# Patient Record
Sex: Female | Born: 1960 | Hispanic: Yes | Marital: Married | Smoking: Former smoker
Health system: Southern US, Community
[De-identification: ages and names within clinical notes are randomized; demographics above are authoritative.]

## PROBLEM LIST (undated history)

## (undated) DIAGNOSIS — M1990 Unspecified osteoarthritis, unspecified site: Secondary | ICD-10-CM

## (undated) DIAGNOSIS — R0600 Dyspnea, unspecified: Secondary | ICD-10-CM

## (undated) DIAGNOSIS — E119 Type 2 diabetes mellitus without complications: Secondary | ICD-10-CM

## (undated) HISTORY — PX: CARPAL TUNNEL RELEASE: SHX101

---

## 2002-08-05 ENCOUNTER — Ambulatory Visit (HOSPITAL_COMMUNITY): Admission: RE | Admit: 2002-08-05 | Discharge: 2002-08-05 | Payer: Self-pay | Admitting: Neurosurgery

## 2002-08-05 ENCOUNTER — Encounter: Payer: Self-pay | Admitting: Neurosurgery

## 2004-08-07 ENCOUNTER — Ambulatory Visit: Payer: Self-pay | Admitting: Specialist

## 2005-09-16 ENCOUNTER — Emergency Department: Payer: Self-pay | Admitting: Emergency Medicine

## 2005-09-24 ENCOUNTER — Ambulatory Visit: Payer: Self-pay | Admitting: Surgery

## 2006-02-26 ENCOUNTER — Ambulatory Visit: Payer: Self-pay | Admitting: Family Medicine

## 2007-04-03 ENCOUNTER — Emergency Department: Payer: Self-pay | Admitting: Emergency Medicine

## 2007-05-13 ENCOUNTER — Ambulatory Visit: Payer: Self-pay | Admitting: Family Medicine

## 2007-05-31 ENCOUNTER — Ambulatory Visit: Payer: Self-pay | Admitting: Family Medicine

## 2009-01-24 ENCOUNTER — Ambulatory Visit: Payer: Self-pay | Admitting: Family Medicine

## 2011-01-09 ENCOUNTER — Emergency Department: Payer: Self-pay | Admitting: Emergency Medicine

## 2012-01-26 ENCOUNTER — Ambulatory Visit: Payer: Self-pay | Admitting: Specialist

## 2012-01-26 LAB — BASIC METABOLIC PANEL
Anion Gap: 13 (ref 7–16)
BUN: 9 mg/dL (ref 7–18)
Calcium, Total: 9.1 mg/dL (ref 8.5–10.1)
Chloride: 104 mmol/L (ref 98–107)
Co2: 24 mmol/L (ref 21–32)
Creatinine: 0.61 mg/dL (ref 0.60–1.30)
EGFR (African American): >60
EGFR (Non-African Amer.): >60
Glucose: 170 mg/dL — ABNORMAL HIGH (ref 65–99)
Osmolality: 284 (ref 275–301)
Potassium: 3.6 mmol/L (ref 3.5–5.1)
Sodium: 141 mmol/L (ref 136–145)

## 2012-01-30 ENCOUNTER — Ambulatory Visit: Payer: Self-pay | Admitting: Specialist

## 2014-08-03 ENCOUNTER — Emergency Department: Payer: Self-pay | Admitting: Emergency Medicine

## 2015-01-08 ENCOUNTER — Emergency Department: Payer: Self-pay | Admitting: Emergency Medicine

## 2015-02-18 NOTE — Op Note (Signed)
PATIENT NAME:  Gwendolyn Fuller, Hayes MR#:  [id number] DATE OF BIRTH:  [birth year]  DATE OF PROCEDURE:  01/30/2012  PREOPERATIVE DIAGNOSIS:  [name] Quervain tenosynovitis right wrist.  POSTOPERATIVE DIAGNOSIS:  [name] Quervain tenosynovitis right wrist.  PROCEDURE: Release of first dorsal extensor compartment, right wrist.   SURGEON: Myra Rudehristopher Chalsey Leeth, M.D.   ANESTHESIA: General.   COMPLICATIONS: None.   TOURNIQUET TIME: Approximately 20 minutes.   DESCRIPTION OF PROCEDURE: After adequate induction of general anesthesia, the right upper extremity is thoroughly prepped with alcohol and ChloraPrep and draped in standard sterile fashion. The extremity is wrapped out with the Esmarch bandage and pneumatic tourniquet elevated to 250 mmHg. Under loupe magnification, a standard transverse incision is made approximately 1 cm proximal to the radial styloid over the first dorsal extensor compartment. The dissection is carried down to the first dorsal extensor compartment. This is incised first with the knife and then the scissors and there are seen to be two separate compartments present and both of these are released. All of the tendons are identified. Careful check is made both proximally and distally to ensure that complete release had been obtained. The wound is thoroughly irrigated multiple times. Skin edges are infiltrated with 0.5% plain Marcaine. The skin is closed with a running subcuticular 4-0 nylon. A soft bulky dressing is applied. The tourniquet is released. The patient is returned to the recovery room in satisfactory condition having tolerated the procedure quite well.    ____________________________ Clare Gandyhristopher E. Anvika Gashi, MD ces:bjt D: 01/30/2012 13:37:41 ET T: 01/30/2012 14:54:13 ET JOB#: [id number] Clare GandyHRISTOPHER E Lameka Disla MD ELECTRONICALLY SIGNED 02/04/2012 17:08

## 2015-06-14 ENCOUNTER — Other Ambulatory Visit: Payer: Self-pay | Admitting: Family Medicine

## 2015-06-15 ENCOUNTER — Telehealth: Payer: Self-pay | Admitting: Family Medicine

## 2015-06-15 NOTE — Telephone Encounter (Signed)
Pt has not been seen since 06/08/2014 . Dr.Morrisey will have to ok any refills.

## 2015-06-15 NOTE — Telephone Encounter (Signed)
Pt called back and stated that her blood sugars have been high(300+) and that she is completely out of her meds. I informed her that she has not had an office visit in a year and that Dr. Thana Ates would have to clear any refills.

## 2015-06-15 NOTE — Telephone Encounter (Signed)
Pt is requesting a refill on her diabetic medications  Pt is completely out of medications.  1. Invokana  2. 1st Tier Unifine Pentips Plus 32G X 90 3. Levemir FlexTouch 100 unit/ml  Pt is scheduled for an OV on Tues. 06/19/15.  Pt uses [facility].  Please contact patient once this is done.

## 2015-06-16 ENCOUNTER — Emergency Department
Admission: EM | Admit: 2015-06-16 | Discharge: 2015-06-16 | Disposition: A | Discharge status: Home / Self Care | Payer: Medicaid Other | Attending: Emergency Medicine | Admitting: Emergency Medicine

## 2015-06-16 DIAGNOSIS — Z720 Tobacco use: Secondary | ICD-10-CM | POA: Diagnosis not present

## 2015-06-16 DIAGNOSIS — R739 Hyperglycemia, unspecified: Secondary | ICD-10-CM

## 2015-06-16 DIAGNOSIS — E1165 Type 2 diabetes mellitus with hyperglycemia: Secondary | ICD-10-CM | POA: Insufficient documentation

## 2015-06-16 DIAGNOSIS — H538 Other visual disturbances: Secondary | ICD-10-CM | POA: Diagnosis not present

## 2015-06-16 HISTORY — DX: Unspecified osteoarthritis, unspecified site: M19.90

## 2015-06-16 HISTORY — DX: Type 2 diabetes mellitus without complications: E11.9

## 2015-06-16 LAB — BASIC METABOLIC PANEL
ANION GAP: 12 (ref 5–15)
BUN: 11 mg/dL (ref 6–20)
CALCIUM: 9.2 mg/dL (ref 8.9–10.3)
CO2: 23 mmol/L (ref 22–32)
CREATININE: 0.7 mg/dL (ref 0.44–1.00)
Chloride: 100 mmol/L — ABNORMAL LOW (ref 101–111)
GFR calc Af Amer: >60 mL/min (ref >60)
GLUCOSE: 348 mg/dL — AB (ref 65–99)
Potassium: 3.8 mmol/L (ref 3.5–5.1)
Sodium: 135 mmol/L (ref 135–145)

## 2015-06-16 LAB — CBC
HCT: 44.5 % (ref 35.0–47.0)
HEMOGLOBIN: 15.1 g/dL (ref 12.0–16.0)
MCH: 29.1 pg (ref 26.0–34.0)
MCHC: 34 g/dL (ref 32.0–36.0)
MCV: 85.7 fL (ref 80.0–100.0)
PLATELETS: 318 10*3/uL (ref 150–440)
RBC: 5.19 MIL/uL (ref 3.80–5.20)
RDW: 13.6 % (ref 11.5–14.5)
WBC: 10.2 10*3/uL (ref 3.6–11.0)

## 2015-06-16 LAB — URINALYSIS COMPLETE WITH MICROSCOPIC (ARMC ONLY)
BILIRUBIN URINE: NEGATIVE
Bacteria, UA: NONE SEEN
HGB URINE DIPSTICK: NEGATIVE
KETONES UR: NEGATIVE mg/dL
Leukocytes, UA: NEGATIVE
NITRITE: NEGATIVE
Protein, ur: NEGATIVE mg/dL
SPECIFIC GRAVITY, URINE: 1.014 (ref 1.005–1.030)
pH: 5 (ref 5.0–8.0)

## 2015-06-16 LAB — GLUCOSE, CAPILLARY
GLUCOSE-CAPILLARY: 323 mg/dL — AB (ref 65–99)
Glucose-Capillary: 356 mg/dL — ABNORMAL HIGH (ref 65–99)
Glucose-Capillary: 390 mg/dL — ABNORMAL HIGH (ref 65–99)
Glucose-Capillary: 344 mg/dL — ABNORMAL HIGH (ref 65–99)

## 2015-06-16 MED ORDER — SODIUM CHLORIDE 0.9 % IV BOLUS (SEPSIS)
1000.0000 mL | Freq: Once | INTRAVENOUS | Status: AC
  Administered 2015-06-16: 1000 mL via INTRAVENOUS

## 2015-06-16 MED ORDER — INSULIN ASPART 100 UNIT/ML ~~LOC~~ SOLN
10.0000 [IU] | Freq: Once | SUBCUTANEOUS | Status: AC
  Administered 2015-06-16: 10 [IU] via SUBCUTANEOUS
  Filled 2015-06-16: qty 10

## 2015-06-16 MED ORDER — GLIMEPIRIDE 4 MG PO TABS: 8.0000 mg | ORAL_TABLET | Freq: Every day | ORAL | Status: DC

## 2015-06-16 NOTE — ED Provider Notes (Signed)
[facility] Emergency Department Provider Note  ____________________________________________  Time seen: 4 PM  I have reviewed the triage vital signs and the nursing notes.   HISTORY  Chief Complaint Hyperglycemia    HPI Gwendolyn Fuller is a 54 y.o. female who presents with complaints of hyperglycemia. Patient with the pharmacy but they would not refill her insulin because she has not seen her physician in quite some time. She reports some mild blurry vision and polyuria. She denies fevers chills. She denies abdominal pain. No cough no shortness of breath. She requests a refill of her insulin and her sulfonylurea     Past Medical History  Diagnosis Date  . Diabetes mellitus without complication   . Arthritis     There are no active problems to display for this patient.   Past Surgical History  Procedure Laterality Date  . Carpal tunnel release Bilateral     No current outpatient prescriptions on file.  Allergies Review of patient's allergies indicates no known allergies.  No family history on file.  Social History Social History  Substance Use Topics  . Smoking status: Current Every Day Smoker -- 1.00 packs/day    Types: Cigarettes  . Smokeless tobacco: Never Used  . Alcohol Use: No    Review of Systems  Constitutional: Negative for fever. Eyes: Positive for blurry vision ENT: Negative for sore throat Cardiovascular: Negative for chest pain. Respiratory: Negative for shortness of breath. Gastrointestinal: Negative for abdominal pain, vomiting and diarrhea. Genitourinary: Negative for dysuria. Positive for polyuria Musculoskeletal: Negative for back pain. Skin: Negative for rash. Neurological: Negative for headaches or focal weakness Psychiatric: No anxiety    ____________________________________________   PHYSICAL EXAM:  VITAL SIGNS: ED Triage Vitals  Enc Vitals Group     BP 06/16/15 1339 109/65 mmHg     Pulse Rate  06/16/15 1339 84     Resp 06/16/15 1339 18     Temp 06/16/15 1339 98.1 F (36.7 C)     Temp Source 06/16/15 1339 Oral     SpO2 06/16/15 1339 98 %     Weight 06/16/15 1339 142 lb (64.411 kg)     Height 06/16/15 1339 5\' 2"  (1.575 m)     Head Cir --      Peak Flow --      Pain Score --      Pain Loc --      Pain Edu? --      Excl. in GC? --      Constitutional: Alert and oriented. Well appearing and in no distress. Eyes: Conjunctivae are normal.  ENT   Head: Normocephalic and atraumatic.   Mouth/Throat: Mucous membranes are moist. Cardiovascular: Normal rate, regular rhythm. Normal and symmetric distal pulses are present in all extremities. No murmurs, rubs, or gallops. Respiratory: Normal respiratory effort without tachypnea nor retractions. Breath sounds are clear and equal bilaterally.  Gastrointestinal: Soft and non-tender in all quadrants. No distention. There is no CVA tenderness. Genitourinary: deferred Musculoskeletal: Nontender with normal range of motion in all extremities. No lower extremity tenderness nor edema. Neurologic:  Normal speech and language. No gross focal neurologic deficits are appreciated. Skin:  Skin is warm, dry and intact. No rash noted. Psychiatric: Mood and affect are normal. Patient exhibits appropriate insight and judgment.  ____________________________________________    LABS (pertinent positives/negatives)  Labs Reviewed  BASIC METABOLIC PANEL - Abnormal; Notable for the following:    Chloride 100 (*)    Glucose, Bld 348 (*)  All other components within normal limits  URINALYSIS COMPLETEWITH MICROSCOPIC ([facility] ONLY) - Abnormal; Notable for the following:    Color, Urine YELLOW (*)    APPearance CLEAR (*)    Glucose, UA >500 (*)    Squamous Epithelial / LPF 0-5 (*)    All other components within normal limits  GLUCOSE, CAPILLARY - Abnormal; Notable for the following:    Glucose-Capillary 356 (*)    All other components within  normal limits  GLUCOSE, CAPILLARY - Abnormal; Notable for the following:    Glucose-Capillary 390 (*)    All other components within normal limits  CBC  CBG MONITORING, ED    ____________________________________________   EKG  None  ____________________________________________    RADIOLOGY I have personally reviewed any xrays that were ordered on this patient: None  ____________________________________________   PROCEDURES  Procedure(s) performed: none  Critical Care performed: none  ____________________________________________   INITIAL IMPRESSION / ASSESSMENT AND PLAN / ED COURSE  Pertinent labs & imaging results that were available during my care of the patient were reviewed by me and considered in my medical decision making (see chart for details).  Patient with blood glucose in the high 300s. We will give 10 of subcutaneous insulin here.   1 hour after insulin patient's glucose was still in the 300s. She is unwilling to wait any longer  I will refill her sulfa urea but she has follow-up with her PCP in 2 days to discuss insulin dosage. She has received 1 L normal saline in the emergency department    ____________________________________________   FINAL CLINICAL IMPRESSION(S) / ED DIAGNOSES  Final diagnoses:  Hyperglycemia     Jene Every, MD 06/16/15 [id number]

## 2015-06-16 NOTE — ED Notes (Signed)
Pt c/o running out of her insulin and her PCP would not RX any until seen on Tuesday..states FS last night was >500 today 384.Marland Kitchen

## 2015-06-16 NOTE — Discharge Instructions (Signed)

## 2015-06-19 ENCOUNTER — Encounter: Payer: Self-pay | Admitting: Family Medicine

## 2015-06-19 ENCOUNTER — Telehealth: Payer: Self-pay | Admitting: Family Medicine

## 2015-06-19 ENCOUNTER — Other Ambulatory Visit: Payer: Self-pay

## 2015-06-19 ENCOUNTER — Ambulatory Visit (INDEPENDENT_AMBULATORY_CARE_PROVIDER_SITE_OTHER): Payer: Medicaid Other | Admitting: Family Medicine

## 2015-06-19 VITALS — BP 128/82 | HR 97 | Temp 97.9°F | Resp 16 | Ht 62.0 in | Wt 141.4 lb

## 2015-06-19 DIAGNOSIS — I10 Essential (primary) hypertension: Secondary | ICD-10-CM

## 2015-06-19 DIAGNOSIS — Z720 Tobacco use: Secondary | ICD-10-CM | POA: Diagnosis not present

## 2015-06-19 DIAGNOSIS — E1140 Type 2 diabetes mellitus with diabetic neuropathy, unspecified: Secondary | ICD-10-CM

## 2015-06-19 DIAGNOSIS — IMO0002 Reserved for concepts with insufficient information to code with codable children: Secondary | ICD-10-CM | POA: Insufficient documentation

## 2015-06-19 DIAGNOSIS — J449 Chronic obstructive pulmonary disease, unspecified: Secondary | ICD-10-CM

## 2015-06-19 DIAGNOSIS — E1165 Type 2 diabetes mellitus with hyperglycemia: Secondary | ICD-10-CM

## 2015-06-19 DIAGNOSIS — E785 Hyperlipidemia, unspecified: Secondary | ICD-10-CM

## 2015-06-19 LAB — GLUCOSE, POCT (MANUAL RESULT ENTRY): POC GLUCOSE: 371 mg/dL — AB (ref 70–99)

## 2015-06-19 LAB — POCT GLYCOSYLATED HEMOGLOBIN (HGB A1C): HEMOGLOBIN A1C: 10.7

## 2015-06-19 MED ORDER — DEXTROMETHORPHAN POLISTIREX ER 30 MG/5ML PO SUER
30.0000 mg | Freq: Two times a day (BID) | ORAL | Status: DC
Start: 2015-06-19 — End: 2017-12-19

## 2015-06-19 MED ORDER — INSULIN PEN NEEDLE 32G X 4 MM MISC: 1.0000 | Status: DC

## 2015-06-19 MED ORDER — INSULIN DETEMIR 100 UNIT/ML ~~LOC~~ SOLN: 1.0000 [IU] | Freq: Every day | SUBCUTANEOUS | Status: DC

## 2015-06-19 MED ORDER — GLUCOSE BLOOD VI STRP: ORAL_STRIP | Status: DC

## 2015-06-19 MED ORDER — ATORVASTATIN CALCIUM 40 MG PO TABS: 40.0000 mg | ORAL_TABLET | Freq: Every day | ORAL | Status: DC

## 2015-06-19 MED ORDER — INSULIN ASPART 100 UNIT/ML ~~LOC~~ SOLN: 10.0000 [IU] | Freq: Once | SUBCUTANEOUS | Status: DC

## 2015-06-19 MED ORDER — LISINOPRIL 10 MG PO TABS
10.0000 mg | ORAL_TABLET | Freq: Every day | ORAL | Status: DC
Start: 2015-06-19 — End: 2016-07-22

## 2015-06-19 MED ORDER — GLIMEPIRIDE 4 MG PO TABS: 8.0000 mg | ORAL_TABLET | Freq: Every day | ORAL | Status: DC

## 2015-06-19 NOTE — Telephone Encounter (Signed)
Patient is at the pharmacy waiting for her refill on Glimepiride. Please call it in to [facility].

## 2015-06-19 NOTE — Progress Notes (Addendum)
Name: Gwendolyn Fuller   MRN: [id number]    DOB: [birth year]   Date:06/19/2015       Progress Note  Subjective  Chief Complaint  Chief Complaint  Patient presents with  . Diabetes    pt has not been seen for a year for diabetes. Pt has been out of medications since 06/15/15  . COPD  . Hospitalization Follow-up    06/16/15 pt was hospitalized for high blood sugars    HPI  Uncontrolled diabetes with neuropathy  Patient remains uncontrolled diabetes, not been back the office in over one year. She is a chronic problem with noncompliance with her medications diet and exercise. She has currently been prescribed glimepiride 8 mg close Levemir 100 mg daily at bedtime. An endocrine milligrams daily. As noted above her compliance with diet and exercise and medication has been less than ideal.  Hyperlipidemia  Patient has been prescribed statin but is not currently taking it.  COPD patient continues with shortness of breath and intermittent cough which more recently has been productive. No documented fever or chills. She continues to smoke despite her COPD and concomitant risk for heart disease with her diabetes hypertension hyperlipidemia noncompliance sedentary lifestyle and stress  Hypertension.  Patient currently is prescribed an ACE inhibitor but is no longer taking apparently.  Problem COPD exacerbation  Past Medical History  Diagnosis Date  . Diabetes mellitus without complication   . Arthritis     Social History  Substance Use Topics  . Smoking status: Current Every Day Smoker -- 1.00 packs/day    Types: Cigarettes  . Smokeless tobacco: Never Used  . Alcohol Use: No     Current outpatient prescriptions:  .  glimepiride (AMARYL) 4 MG tablet, Take 2 tablets (8 mg total) by mouth daily with breakfast., Disp: 20 tablet, Rfl: 0 .  insulin detemir (LEVEMIR) 100 UNIT/ML injection, Inject into the skin at bedtime., Disp: , Rfl:   No Known Allergies  Review of Systems   Constitutional: Positive for malaise/fatigue. Negative for fever, chills and weight loss.  HENT: Positive for congestion. Negative for hearing loss, sore throat and tinnitus.   Eyes: Negative for blurred vision, double vision and redness.  Respiratory: Positive for cough, sputum production and shortness of breath. Negative for hemoptysis.   Cardiovascular: Negative for chest pain, palpitations, orthopnea, claudication and leg swelling.  Gastrointestinal: Negative for heartburn, nausea, vomiting, diarrhea, constipation and blood in stool.  Genitourinary: Positive for frequency. Negative for dysuria, urgency and hematuria.  Musculoskeletal: Positive for back pain and joint pain. Negative for myalgias, falls and neck pain.  Skin: Negative for itching.  Neurological: Positive for sensory change. Negative for dizziness, tingling, tremors, focal weakness, seizures, loss of consciousness, weakness and headaches.  Endo/Heme/Allergies: Positive for polydipsia. Does not bruise/bleed easily.  Psychiatric/Behavioral: Positive for depression. Negative for substance abuse. The patient is nervous/anxious and has insomnia.      Objective  Filed Vitals:   06/19/15 0908  BP: 128/82  Pulse: 97  Temp: 97.9 F (36.6 C)  Resp: 16  Height: 5\' 2"  (1.575 m)  Weight: 141 lb 7 oz (64.156 kg)  SpO2: 97%     Physical Exam  Constitutional: [name], place, and time and well-developed, well-nourished, and in no distress.  HENT:  Head: Normocephalic.  Eyes: EOM are normal. Pupils are equal, round, and reactive to light.  Neck: Normal range of motion. No thyromegaly present.  Cardiovascular: Normal rate, regular rhythm and normal heart sounds.   No  murmur heard. Pulmonary/Chest: Effort normal and breath sounds normal.  Abdominal: Soft. Bowel sounds are normal.  Musculoskeletal: Normal range of motion. She exhibits no edema.  Neurological: She is alert and oriented to person, place, and  time. No cranial nerve deficit. Gait normal.  Skin: Skin is warm and dry. No rash noted.  Psychiatric: Memory normal.  Anxious and somewhat loquacious      Assessment & Plan    1. Uncontrolled type 2 diabetes with neuropathy History current noncompliance well-controlled - POCT HgB A1C - POCT Glucose (CBG) - insulin aspart (novoLOG) injection 10 Units; Inject 0.1 mLs (10 Units total) into the skin once. - Fructosamine - glucose blood (ACCU-CHEK AVIVA PLUS) test strip; Use as instructed  Dispense: 100 each; Refill: 12 - Insulin Pen Needle (UNIFINE PENTIPS) 32G X 4 MM MISC; 1 Package by Does not apply route 1 day or 1 dose.  Dispense: 30 each; Refill: 3 - insulin detemir (LEVEMIR) 100 UNIT/ML injection; Inject 0.01 mLs (1 Units total) into the skin at bedtime.  Dispense: 10 mL; Refill: 3  2. Essential hypertension - lisinopril (PRINIVIL,ZESTRIL) 10 MG tablet; Take 1 tablet (10 mg total) by mouth daily.  Dispense: 90 tablet; Refill: 3  3. Hyperlipidemia  Atorvastatin  - atorvastatin (LIPITOR) 40 MG tablet; Take 1 tablet (40 mg total) by mouth daily.  Dispense: 90 tablet; Refill: 3  4. Chronic obstructive pulmonary disease, unspecified COPD, unspecified chronic bronchitis type Attending I again emphasized the need to discontinue smoking smoking cessation - dextromethorphan (DELSYM) 30 MG/5ML liquid 30 mg; Take 5 mLs (30 mg total) by mouth 2 (two) times daily.  5. Tobacco abuse Smoking cessation encouraged

## 2015-06-20 LAB — FRUCTOSAMINE: Fructosamine: 479 umol/L — ABNORMAL HIGH (ref 0–285)

## 2015-06-21 NOTE — Addendum Note (Signed)
Addended by: Dennison Mascot on: 06/21/2015 08:07 AM   Modules accepted: Kipp Brood

## 2015-07-23 ENCOUNTER — Telehealth: Payer: Self-pay | Admitting: Family Medicine

## 2015-07-23 ENCOUNTER — Ambulatory Visit: Payer: Medicaid Other | Admitting: Family Medicine

## 2015-07-23 DIAGNOSIS — E785 Hyperlipidemia, unspecified: Secondary | ICD-10-CM

## 2015-07-23 NOTE — Telephone Encounter (Signed)
Pt needs refill on Glimepieride  . Pt has only one left

## 2015-07-24 MED ORDER — GLIMEPIRIDE 4 MG PO TABS: 8.0000 mg | ORAL_TABLET | Freq: Every day | ORAL | Status: DC

## 2015-07-24 NOTE — Telephone Encounter (Signed)
Script sent to pharmacy.

## 2015-07-25 ENCOUNTER — Encounter: Payer: Self-pay | Admitting: Family Medicine

## 2015-07-25 ENCOUNTER — Ambulatory Visit (INDEPENDENT_AMBULATORY_CARE_PROVIDER_SITE_OTHER): Payer: Medicaid Other | Admitting: Family Medicine

## 2015-07-25 VITALS — BP 118/68 | HR 84 | Temp 97.5°F | Resp 16 | Wt 144.2 lb

## 2015-07-25 DIAGNOSIS — G5601 Carpal tunnel syndrome, right upper limb: Secondary | ICD-10-CM | POA: Diagnosis not present

## 2015-07-25 DIAGNOSIS — G5602 Carpal tunnel syndrome, left upper limb: Secondary | ICD-10-CM | POA: Diagnosis not present

## 2015-07-25 DIAGNOSIS — G990 Autonomic neuropathy in diseases classified elsewhere: Secondary | ICD-10-CM | POA: Diagnosis not present

## 2015-07-25 DIAGNOSIS — IMO0001 Reserved for inherently not codable concepts without codable children: Secondary | ICD-10-CM

## 2015-07-25 DIAGNOSIS — J029 Acute pharyngitis, unspecified: Secondary | ICD-10-CM | POA: Diagnosis not present

## 2015-07-25 DIAGNOSIS — E785 Hyperlipidemia, unspecified: Secondary | ICD-10-CM

## 2015-07-25 DIAGNOSIS — E1143 Type 2 diabetes mellitus with diabetic autonomic (poly)neuropathy: Secondary | ICD-10-CM

## 2015-07-25 DIAGNOSIS — M1990 Unspecified osteoarthritis, unspecified site: Secondary | ICD-10-CM | POA: Diagnosis not present

## 2015-07-25 DIAGNOSIS — I10 Essential (primary) hypertension: Secondary | ICD-10-CM

## 2015-07-25 DIAGNOSIS — G5603 Carpal tunnel syndrome, bilateral upper limbs: Secondary | ICD-10-CM

## 2015-07-25 DIAGNOSIS — IMO0002 Reserved for concepts with insufficient information to code with codable children: Secondary | ICD-10-CM

## 2015-07-25 DIAGNOSIS — J449 Chronic obstructive pulmonary disease, unspecified: Secondary | ICD-10-CM | POA: Diagnosis not present

## 2015-07-25 DIAGNOSIS — E1165 Type 2 diabetes mellitus with hyperglycemia: Principal | ICD-10-CM

## 2015-07-25 LAB — POCT RAPID STREP A (OFFICE): Rapid Strep A Screen: NEGATIVE

## 2015-07-25 MED ORDER — MELOXICAM 15 MG PO TABS: 15.0000 mg | ORAL_TABLET | Freq: Every day | ORAL | Status: DC

## 2015-07-25 MED ORDER — GLIMEPIRIDE 4 MG PO TABS: 8.0000 mg | ORAL_TABLET | Freq: Every day | ORAL | Status: DC

## 2015-07-25 NOTE — Progress Notes (Signed)
Name: Gwendolyn Fuller   MRN: [id number]    DOB: 25-Dec-1960   Date:07/25/2015       Progress Note  Subjective  Chief Complaint  Chief Complaint  Patient presents with  . Diabetes  . Carpal Tunnel    HPI  Diabetes  Patient presents for follow-up of diabetes which is present for over 5 years. Is currently on a regimen of Imdur, 100 mg daily and Levemir 58 units daily glyburide 8 mg daily . Patient states intermittent with their diet and exercise. There's been no hypoglycemic episodes and there is no polyuria polydipsia polyphagia. [name] been in the low around mid 100s with a high around low 300s . There is no end organ disease.  Last diabetic eye exam was greater than 1 year ago.   Last visit with dietitian was greater than 1 year ago. Last microalbumin was obtained 20 in 05/30/2015 .   Hypertension   Patient presents for follow-up of hypertension. It has been present for over greater than 5 years.  Patient states that there is compliance with medical regimen which consists of lisinopril 10 mg daily . There is no end organ disease. Cardiac risk factors include hypertension hyperlipidemia and diabetes.  Exercise regimen consist of minimal .  Diet consist of limited ADA .  COPD Patient continues to smoke a regular basis. [name] and has not had it refilled. Has minimal cough in the a.m.  Carpal tunnel syndrome  Bilateral pain and numbness in the dorsal aspect of the wrist radiating to the hands  Complaint of bilateral wrist and hand pain. [name].  Arthritis Pain and both hands particularly the left thumb after an injury while closing a computer that  Complaint of pain and discomfort in the fingers O Pham but particularly the left thumb. 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 Several weeks ago   Past Medical History  Diagnosis Date  . Diabetes mellitus  without complication   . Arthritis     Social History  Substance Use Topics  . Smoking status: Current Every Day Smoker -- 1.00 packs/day    Types: Cigarettes  . Smokeless tobacco: Never Used  . Alcohol Use: No     Current outpatient prescriptions:  .  atorvastatin (LIPITOR) 40 MG tablet, Take 1 tablet (40 mg total) by mouth daily., Disp: 90 tablet, Rfl: 3 .  glimepiride (AMARYL) 4 MG tablet, Take 2 tablets (8 mg total) by mouth daily with breakfast., Disp: 60 tablet, Rfl: 5 .  glucose blood (ACCU-CHEK AVIVA PLUS) test strip, Use as instructed, Disp: 100 each, Rfl: 12 .  insulin detemir (LEVEMIR) 100 UNIT/ML injection, Inject 0.01 mLs (1 Units total) into the skin at bedtime., Disp: 10 mL, Rfl: 3 .  Insulin Pen Needle (UNIFINE PENTIPS) 32G X 4 MM MISC, 1 Package by Does not apply route 1 day or 1 dose., Disp: 30 each, Rfl: 3 .  lisinopril (PRINIVIL,ZESTRIL) 10 MG tablet, Take 1 tablet (10 mg total) by mouth daily., Disp: 90 tablet, Rfl: 3  Current facility-administered medications:  .  dextromethorphan (DELSYM) 30 MG/5ML liquid 30 mg, 30 mg, Oral, BID, Ashok Norris, MD .  insulin aspart (novoLOG) injection 10 Units, 10 Units, Subcutaneous, Once, Ashok Norris, MD  No Known Allergies  Review of Systems  Constitutional: Negative for fever, chills and weight loss.  HENT: Negative for congestion, hearing loss,  sore throat and tinnitus.   Eyes: Negative for blurred vision, double vision and redness.  Respiratory: Positive for cough and sputum production. Negative for hemoptysis and shortness of breath.   Cardiovascular: Negative for chest pain, palpitations, orthopnea, claudication and leg swelling.  Gastrointestinal: Negative for heartburn, nausea, vomiting, diarrhea, constipation and blood in stool.  Genitourinary: Negative for dysuria, urgency, frequency and hematuria.  Musculoskeletal: Positive for back pain and joint pain (both wrists and hands particularly the left thumb).  Negative for myalgias, falls and neck pain.  Skin: Negative for itching.  Neurological: Positive for weakness. Negative for dizziness, tingling, tremors, focal weakness, seizures, loss of consciousness and headaches.  Endo/Heme/Allergies: Does not bruise/bleed easily.  Psychiatric/Behavioral: Negative for depression and substance abuse. The patient is nervous/anxious. The patient does not have insomnia.      Objective  Filed Vitals:   07/25/15 1042  BP: 118/68  Pulse: 84  Temp: 97.5 F (36.4 C)  Resp: 16  Weight: 144 lb 3 oz (65.403 kg)  SpO2: 97%     Physical Exam  Constitutional: [name], place, and time and well-developed, well-nourished, and in no distress.  HENT:  Head: Normocephalic.  Eyes: EOM are normal. Pupils are equal, round, and reactive to light.  Neck: Normal range of motion. No thyromegaly present.  Cardiovascular: Normal rate, regular rhythm and normal heart sounds.   No murmur heard. Pulmonary/Chest:  Slightly diminished breath sounds throughout.  Musculoskeletal: She exhibits tenderness (tender along the lumbosacral areas bilaterally straight leg raising is positive at 30). She exhibits no edema.  Neurological: She is alert and oriented to person, place, and time. No cranial nerve deficit. Gait normal.  Skin: Skin is warm and dry. No rash noted.  Psychiatric: Memory and affect normal.      Assessment & Plan  1. Uncontrolled diabetes mellitus with peripheral autonomic neuropathy Better control since adding in the, - Fructosamine  2. Essential hypertension Well-controlled - Comprehensive Metabolic Panel (CMET)  3. Hyperlipidemia Labs - Comprehensive Metabolic Panel (CMET) - Lipid Profile - TSH  4. COPD bronchitis Again encouraged discontinuation of smoking "to do PFTs on return to office  5. Pharyngitis Rule out strep - POCT rapid strep A  6. Arthritis Rule out connective tissue disease or autoimmune arthritis  - DG  Hand Complete Left; Future - CBC - Sed Rate (ESR)  7.carpal tunnel syndrome - Wrist splint

## 2015-08-17 ENCOUNTER — Ambulatory Visit: Payer: Medicaid Other | Admitting: Family Medicine

## 2015-09-25 ENCOUNTER — Telehealth: Payer: Self-pay | Admitting: Family Medicine

## 2015-09-25 NOTE — Telephone Encounter (Signed)
Patient was offered appointment with Dr Carlynn PurlSowles for this afternoon, however, she will only have transportation until 1pm due to son having to go to work. I then spoke with Myriam JacobsonHelen and she told me to offer the patient to go to Urgent Care. Patient stated that she called all the Urgent Cares and they are not able to do what she needs done to help her swollen gums. Patient then ask if I would please ask a provider here if they would please call her in an [facility] to help her gums because she is in a lot of pain. I told her that I will ask. Last seen by Dr Thana AtesMorrisey 07/25/15

## 2015-09-25 NOTE — Telephone Encounter (Signed)
Patient is reqeusting a referral to a dentist due to inflammed gums. All her teeth are throbbing. Think she has abcess/bubble located in her mouth. It hurts to talk and eat.

## 2015-09-25 NOTE — Telephone Encounter (Signed)
Patient spoke with Efraim KaufmannMelissa and told her that she went to urgent care. They told her that is there is no improvement to call her PCP to schedule appointment for tomorrow

## 2015-09-25 NOTE — Telephone Encounter (Signed)
I can't call in antibiotics for her gum, needs to go to Urgent Care, or come in tomorrow

## 2015-09-25 NOTE — Telephone Encounter (Signed)
errenous °

## 2015-09-28 ENCOUNTER — Ambulatory Visit (INDEPENDENT_AMBULATORY_CARE_PROVIDER_SITE_OTHER): Payer: Medicaid Other | Admitting: Family Medicine

## 2015-09-28 ENCOUNTER — Encounter: Payer: Self-pay | Admitting: Family Medicine

## 2015-09-28 VITALS — BP 126/72 | HR 111 | Resp 16 | Ht 64.0 in | Wt 140.2 lb

## 2015-09-28 DIAGNOSIS — IMO0002 Reserved for concepts with insufficient information to code with codable children: Secondary | ICD-10-CM

## 2015-09-28 DIAGNOSIS — E1165 Type 2 diabetes mellitus with hyperglycemia: Secondary | ICD-10-CM | POA: Diagnosis not present

## 2015-09-28 DIAGNOSIS — K122 Cellulitis and abscess of mouth: Secondary | ICD-10-CM | POA: Diagnosis not present

## 2015-09-28 DIAGNOSIS — E1140 Type 2 diabetes mellitus with diabetic neuropathy, unspecified: Secondary | ICD-10-CM

## 2015-09-28 MED ORDER — CEFTRIAXONE SODIUM 1 G IJ SOLR: 500.0000 mg | Freq: Once | INTRAMUSCULAR | Status: DC

## 2015-09-28 MED ORDER — CEFTRIAXONE SODIUM 1 G IJ SOLR
1.0000 g | Freq: Once | INTRAMUSCULAR | Status: DC
Start: 2015-09-28 — End: 2015-09-28

## 2015-09-28 MED ORDER — CLINDAMYCIN HCL 300 MG PO CAPS: 300.0000 mg | ORAL_CAPSULE | Freq: Three times a day (TID) | ORAL | Status: DC

## 2015-09-28 MED ORDER — MAGIC MOUTHWASH W/LIDOCAINE: 5.0000 mL | Freq: Three times a day (TID) | ORAL | Status: DC

## 2015-09-28 NOTE — Progress Notes (Signed)
Name: Gwendolyn StackMaritza Fuller   MRN: [id number]    DOB: January 13, 1961   Date:09/28/2015       Progress Note  Subjective  Chief Complaint  Chief Complaint  Patient presents with  . Oral Pain    for 2 weeks seen at Urgent Care    HPI  Overall pain  Patient seen in urgent care about 2 weeks ago when she had an injury in her mouth after chewing on a hard object in her food. At the urgent care she was given Xylocaine Viscous. However discomfort is not improved in fact is worsening. There is no fever or chills. She is diabetic. Blood sugars have been running higher.   Diabetes mellitus.  History of diabetes for over 5 years. She is on glimepiride 4 mg daily and Levemir at night. Blood sugars have been running in the 3 to 400s of recent since her diabetes is worsening. Past Medical History  Diagnosis Date  . Diabetes mellitus without complication (HCC)   . Arthritis     Social History  Substance Use Topics  . Smoking status: Current Every Day Smoker -- 1.00 packs/day    Types: Cigarettes  . Smokeless tobacco: Never Used  . Alcohol Use: No     Current outpatient prescriptions:  .  atorvastatin (LIPITOR) 40 MG tablet, Take 1 tablet (40 mg total) by mouth daily., Disp: 90 tablet, Rfl: 3 .  glimepiride (AMARYL) 4 MG tablet, Take 2 tablets (8 mg total) by mouth daily with breakfast., Disp: 60 tablet, Rfl: 5 .  glucose blood (ACCU-CHEK AVIVA PLUS) test strip, Use as instructed, Disp: 100 each, Rfl: 12 .  insulin detemir (LEVEMIR) 100 UNIT/ML injection, Inject 0.01 mLs (1 Units total) into the skin at bedtime., Disp: 10 mL, Rfl: 3 .  Insulin Pen Needle (UNIFINE PENTIPS) 32G X 4 MM MISC, 1 Package by Does not apply route 1 day or 1 dose., Disp: 30 each, Rfl: 3 .  lisinopril (PRINIVIL,ZESTRIL) 10 MG tablet, Take 1 tablet (10 mg total) by mouth daily., Disp: 90 tablet, Rfl: 3 .  meloxicam (MOBIC) 15 MG tablet, Take 1 tablet (15 mg total) by mouth daily., Disp: 30 tablet, Rfl: 0  Current  facility-administered medications:  .  dextromethorphan (DELSYM) 30 MG/5ML liquid 30 mg, 30 mg, Oral, BID, Dennison MascotLemont Corina Stacy, MD .  insulin aspart (novoLOG) injection 10 Units, 10 Units, Subcutaneous, Once, Dennison MascotLemont Kashius Dominic, MD  No Known Allergies  Review of Systems  Constitutional: Negative for fever and chills.  HENT:       Oral lesion and upper labial swelling has been present.     Objective  Filed Vitals:   09/28/15 0742  BP: 126/72  Pulse: 111  Resp: 16  Height: 5\' 4"  (1.626 m)  Weight: 140 lb 3.2 oz (63.594 kg)  SpO2: 96%     Physical Exam  Constitutional:  Patient is in moderate distress.  HENT:  Right Ear: External ear normal.  Left Ear: External ear normal.  There is some swelling of the upper lip but no palpable fluctuance. On the hard palate just behind the upper teeth fair is an indurated area of erythema and marked tenderness. No fluctuance can be appreciated. The TMs are clear cervical nodes are slightly tender.      Assessment & Plan  1. Cellulitis of parenteral antibiotics with Rocephin and oral clindamycin to cover anaerobes   clindamycin (CLEOCIN) 300 MG capsule; Take 1 capsule (300 mg total) by mouth 3 (three) times daily.  Dispense: 30 capsule;  Refill: 0 - magic mouthwash w/lidocaine SOLN; Take 5 mLs by mouth 3 (three) times daily.  Dispense: 5 mL; Refill: 0 - cefTRIAXone (ROCEPHIN) injection 500 mg; Inject 0.5 g (500 mg total) into the muscle once. - cefTRIAXone (ROCEPHIN) injection 500 mg; Inject 0.5 g (500 mg total) into the muscle once. . If no improvement then referral to ENT-  2. Uncontrolled type 2 diabetes with neuropathy (HCC)  increase Levemir by 2 more units into the night.

## 2015-10-01 ENCOUNTER — Telehealth: Payer: Self-pay | Admitting: Family Medicine

## 2015-10-01 NOTE — Telephone Encounter (Signed)
Patient is not getting any better and is reqeusting a referral appointment to ear, nose, & throat doctor.  [phone] (patient cell) [phone] (Juan-husband cell)

## 2015-10-04 NOTE — Telephone Encounter (Signed)
Read pt last notes from last visit, [facility]

## 2015-10-04 NOTE — Telephone Encounter (Signed)
ok 

## 2015-10-05 ENCOUNTER — Emergency Department
Admission: EM | Admit: 2015-10-05 | Discharge: 2015-10-05 | Disposition: A | Discharge status: Home / Self Care | Payer: Medicaid Other | Attending: Emergency Medicine | Admitting: Emergency Medicine

## 2015-10-05 ENCOUNTER — Encounter: Payer: Self-pay | Admitting: Emergency Medicine

## 2015-10-05 DIAGNOSIS — Z79899 Other long term (current) drug therapy: Secondary | ICD-10-CM | POA: Diagnosis not present

## 2015-10-05 DIAGNOSIS — Z791 Long term (current) use of non-steroidal anti-inflammatories (NSAID): Secondary | ICD-10-CM | POA: Insufficient documentation

## 2015-10-05 DIAGNOSIS — K122 Cellulitis and abscess of mouth: Secondary | ICD-10-CM | POA: Insufficient documentation

## 2015-10-05 DIAGNOSIS — K0889 Other specified disorders of teeth and supporting structures: Secondary | ICD-10-CM | POA: Diagnosis present

## 2015-10-05 DIAGNOSIS — I10 Essential (primary) hypertension: Secondary | ICD-10-CM | POA: Insufficient documentation

## 2015-10-05 DIAGNOSIS — F17210 Nicotine dependence, cigarettes, uncomplicated: Secondary | ICD-10-CM | POA: Diagnosis not present

## 2015-10-05 DIAGNOSIS — K029 Dental caries, unspecified: Secondary | ICD-10-CM | POA: Insufficient documentation

## 2015-10-05 DIAGNOSIS — Z794 Long term (current) use of insulin: Secondary | ICD-10-CM | POA: Insufficient documentation

## 2015-10-05 DIAGNOSIS — E1140 Type 2 diabetes mellitus with diabetic neuropathy, unspecified: Secondary | ICD-10-CM | POA: Insufficient documentation

## 2015-10-05 DIAGNOSIS — Z792 Long term (current) use of antibiotics: Secondary | ICD-10-CM | POA: Insufficient documentation

## 2015-10-05 MED ORDER — HYDROCODONE-ACETAMINOPHEN 5-325 MG PO TABS
1.0000 | ORAL_TABLET | Freq: Once | ORAL | Status: AC
  Administered 2015-10-05: 1 via ORAL

## 2015-10-05 MED ORDER — LIDOCAINE-EPINEPHRINE 2 %-1:100000 IJ SOLN
INTRAMUSCULAR | Status: AC
  Filled 2015-10-05: qty 1.7

## 2015-10-05 MED ORDER — HYDROCODONE-ACETAMINOPHEN 5-325 MG PO TABS: 1.0000 | ORAL_TABLET | Freq: Three times a day (TID) | ORAL | Status: DC | PRN

## 2015-10-05 MED ORDER — LIDOCAINE-EPINEPHRINE 2 %-1:100000 IJ SOLN: 1.7000 mL | Freq: Once | INTRAMUSCULAR | Status: DC

## 2015-10-05 MED ORDER — HYDROCODONE-ACETAMINOPHEN 5-325 MG PO TABS
ORAL_TABLET | ORAL | Status: DC
Start: 2015-10-05 — End: 2015-10-05
  Filled 2015-10-05: qty 1

## 2015-10-05 NOTE — Discharge Instructions (Signed)
Abscess An abscess (boil or furuncle) is an infected area on or under the skin. This area is filled with yellowish-white fluid (pus) and other material (debris). HOME CARE   Only take medicines as told by your doctor.  If you were given antibiotic medicine, take it as directed. Finish the medicine even if you start to feel better.  If gauze is used, follow your doctor's directions for changing the gauze.  To avoid spreading the infection:  Keep your abscess covered with a bandage.  Wash your hands well.  Do not share personal care items, towels, or whirlpools with others.  Avoid skin contact with others.  Keep your skin and clothes clean around the abscess.  Keep all doctor visits as told. GET HELP RIGHT AWAY IF:   You have more pain, puffiness (swelling), or redness in the wound site.  You have more fluid or blood coming from the wound site.  You have muscle aches, chills, or you feel sick.  You have a fever. MAKE SURE YOU:   Understand these instructions.  Will watch your condition.  Will get help right away if you are not doing well or get worse.   This information is not intended to replace advice given to you by your health care provider. Make sure you discuss any questions you have with your health care provider.   Document Released: 03/31/2008 Document Revised: 04/13/2012 Document Reviewed: 12/27/2011 Elsevier Interactive Patient Education 2016 [employer] Inc[employer]  Dental Pain Dental pain may be caused by many things, including:  Tooth decay (cavities or caries). Cavities cause the nerve of your tooth to be open to air and hot or cold temperatures. This can cause pain or discomfort.  Abscess or infection. A dental abscess is an area that is full of infected pus from a bacterial infection in the inner part of the tooth (pulp). It usually happens at the end of the tooth's root.  Injury.  An unknown reason (idiopathic). Your pain may be mild or severe. It may  only happen when:  You are chewing.  You are exposed to hot or cold temperature.  You are eating or drinking sugary foods or beverages, such as:  Soda.  Candy. Your pain may also be there all of the time. HOME CARE Watch your dental pain for any changes. Do these things to lessen your discomfort:  Take medicines only as told by your dentist.  If your dentist tells you to take an antibiotic medicine, finish all of it even if you start to feel better.  Keep all follow-up visits as told by your dentist. This is important.  Do not apply heat to the outside of your face.  Rinse your mouth or gargle with salt water if told by your dentist. This helps with pain and swelling.  You can make salt water by adding  tsp of salt to 1 cup of warm water.  Apply ice to the painful area of your face:  Put ice in a plastic bag.  Place a towel between your skin and the bag.  Leave the ice on for 20 minutes, 2-3 times per day.  Avoid foods or drinks that cause you pain, such as:  Very hot or very cold foods or drinks.  Sweet or sugary foods or drinks. GET HELP IF:  Your pain is not helped with medicines.  Your symptoms are worse.  You have new symptoms. GET HELP RIGHT AWAY IF:  You cannot open your mouth.  You are having trouble  breathing or swallowing.  You have a fever.  Your face, neck, or jaw is puffy (swollen).   This information is not intended to replace advice given to you by your health care provider. Make sure you discuss any questions you have with your health care provider.   Document Released: 03/31/2008 Document Revised: 02/27/2015 Document Reviewed: 10/09/2014 Elsevier Interactive Patient Education 2016 [employer]  Continue with the antibiotic as previously prescribed. Rinse daily with warm, salty water. Follow-up with Dr. Thana [facility]esMorrisey next week.

## 2015-10-05 NOTE — ED Notes (Signed)
Per family she developed an ulcer to roof of mouth about 2 weeks ago..  Increased swelling and pain today

## 2015-10-05 NOTE — ED Provider Notes (Signed)
[facility] Emergency Department Provider Note ____________________________________________  Time seen: 1524  I have reviewed the triage vital signs and the nursing notes.  HISTORY  Chief Complaint  Dental Pain  HPI Gwendolyn Fuller is a 54 y.o. female presents to the ED accompanied by her husband for evaluation of a abscess and draining ulcer for the roof of the mouth. The initial abscess develop about 2 weeks ago at the gumline behind her left secondary incisor. She was evaluated by local urgent care provider and placed on a lidocaine mouthwash for comfort. Since that time Dr. Charlette Caffey is called in clindamycin or management of the infection. She is noted continued swelling and fullness to the top of the mouth as well as some spontaneous drainage. She stated today for evaluation of this continued swelling to the roof of the mouth. She denies any interim fever, chills, sweats.  Past Medical History  Diagnosis Date  . Diabetes mellitus without complication (HCC)   . Arthritis     Patient Active Problem List   Diagnosis Date Noted  . Cellulitis of oral soft tissues 09/28/2015  . Uncontrolled type 2 diabetes with neuropathy (HCC) 06/19/2015  . Essential hypertension 06/19/2015  . Hyperlipidemia 06/19/2015  . COLD (chronic obstructive lung disease) (HCC) 06/19/2015  . Tobacco abuse 06/19/2015    Past Surgical History  Procedure Laterality Date  . Carpal tunnel release Bilateral     Current Outpatient Rx  Name  Route  Sig  Dispense  Refill  . atorvastatin (LIPITOR) 40 MG tablet   Oral   Take 1 tablet (40 mg total) by mouth daily.   90 tablet   3   . clindamycin (CLEOCIN) 300 MG capsule   Oral   Take 1 capsule (300 mg total) by mouth 3 (three) times daily.   30 capsule   0   . glimepiride (AMARYL) 4 MG tablet   Oral   Take 2 tablets (8 mg total) by mouth daily with breakfast.   60 tablet   5   . glucose blood (ACCU-CHEK AVIVA PLUS) test strip      Use as instructed   100 each   12   . HYDROcodone-acetaminophen (NORCO) 5-325 MG tablet   Oral   Take 1 tablet by mouth every 8 (eight) hours as needed for moderate pain.   10 tablet   0   . insulin detemir (LEVEMIR) 100 UNIT/ML injection   Subcutaneous   Inject 0.01 mLs (1 Units total) into the skin at bedtime.   10 mL   3   . Insulin Pen Needle (UNIFINE PENTIPS) 32G X 4 MM MISC   Does not apply   1 Package by Does not apply route 1 day or 1 dose.   30 each   3   . lisinopril (PRINIVIL,ZESTRIL) 10 MG tablet   Oral   Take 1 tablet (10 mg total) by mouth daily.   90 tablet   3   . magic mouthwash w/lidocaine SOLN   Oral   Take 5 mLs by mouth 3 (three) times daily.   5 mL   0     [facility] magic mouth wash 2 tbs swish and spit   . meloxicam (MOBIC) 15 MG tablet   Oral   Take 1 tablet (15 mg total) by mouth daily.   30 tablet   0    Allergies Review of patient's allergies indicates no known allergies.  No family history on file.  Social History Social History  Substance Use Topics  . Smoking status: Current Every Day Smoker -- 1.00 packs/day    Types: Cigarettes  . Smokeless tobacco: Never Used  . Alcohol Use: No   Review of Systems  Constitutional: Negative for fever. Eyes: Negative for visual changes. ENT: Negative for sore throat. Dental pain and mouth pain as above Cardiovascular: Negative for chest pain. Respiratory: Negative for shortness of breath. Gastrointestinal: Negative for abdominal pain, vomiting and diarrhea. Genitourinary: Negative for dysuria. Musculoskeletal: Negative for back pain. Skin: Negative for rash. Neurological: Negative for headaches, focal weakness or numbness. ____________________________________________  PHYSICAL EXAM:  VITAL SIGNS: ED Triage Vitals  Enc Vitals Group     BP 10/05/15 1500 145/78 mmHg     Pulse Rate 10/05/15 1500 84     Resp 10/05/15 1500 20     Temp 10/05/15 1500 97.8 F (36.6 C)     Temp  Source 10/05/15 1500 Oral     SpO2 10/05/15 1500 97 %     Weight 10/05/15 1500 140 lb (63.504 kg)     Height 10/05/15 1500  (1.626 m)     Head Cir --      Peak Flow --      Pain Score 10/05/15 1501 9     Pain Loc --      Pain Edu? --      Excl. in GC? --    Constitutional: Alert and oriented. Well appearing and in no distress. Head: Normocephalic and atraumatic.      Eyes: Conjunctivae are normal. PERRL. Normal extraocular movements      Ears: Canals clear. TMs intact bilaterally.   Nose: No congestion/rhinorrhea.   Mouth/Throat: Mucous membranes are moist. Uvula is midline and tonsils are flat. Patient is noted to have soft tissue swelling to the hard palate in the midline of the mouth. Consistent with abscess formation. Manipulation of the swollen are pallet produces purulent discharge which appears to be tracking and draining at the left secondary canine.   Neck: Supple. No thyromegaly. Hematological/Lymphatic/Immunological: No cervical lymphadenopathy. Cardiovascular: Normal rate, regular rhythm.  Respiratory: Normal respiratory effort. No wheezes/rales/rhonchi. Gastrointestinal: Soft and nontender. No distention. Musculoskeletal: Nontender with normal range of motion in all extremities.  Neurologic:  Normal gait without ataxia. Normal speech and language. No gross focal neurologic deficits are appreciated. Skin:  Skin is warm, dry and intact. No rash noted. Psychiatric: Mood and affect are normal. Patient exhibits appropriate insight and judgment. ____________________________________________  PROCEDURES  Norco 5-325 mg PO  INCISION AND DRAINAGE Performed by: Lissa Hoard Consent: Verbal consent obtained. Risks and benefits: risks, benefits and alternatives were discussed Type: abscess  Body area: hard palate  Anesthesia: local infiltration  Incision was made with a scalpel.  Local anesthetic: lidocaine 2% w/ epinephrine  Anesthetic total:  1.7 ml  Complexity: complex  Drainage: purulent  Drainage amount: 3 ml  Patient tolerance: Patient tolerated the procedure well with no immediate complications. ____________________________________________  INITIAL IMPRESSION / ASSESSMENT AND PLAN / ED COURSE  Patient with acute dental pain due to caries and a normal abscess to the hard palate status post I&D. Patient is to continue the clindamycin as previous prescribed. She is provided a prescription for Vicodin to dose as needed for acute pain. He is encouraged to rinse her mouth regularly with warm salty water. She will follow-up with Dr. Thana Ates or her dental provider in 2-3 days as planned. ____________________________________________  FINAL CLINICAL IMPRESSION(S) / ED DIAGNOSES  Final diagnoses:  Oral abscess  Pain due to dental caries       Lissa HoardJenise V Bacon Nikoleta Dady, PA-C 10/05/15 2348  Loleta Roseory Forbach, MD 10/05/15 2358

## 2015-10-24 ENCOUNTER — Ambulatory Visit (INDEPENDENT_AMBULATORY_CARE_PROVIDER_SITE_OTHER): Payer: Medicaid Other | Admitting: Family Medicine

## 2015-10-24 ENCOUNTER — Encounter: Payer: Self-pay | Admitting: Family Medicine

## 2015-10-24 VITALS — BP 124/68 | HR 104 | Temp 97.9°F | Resp 18 | Ht 64.0 in | Wt 143.2 lb

## 2015-10-24 DIAGNOSIS — I10 Essential (primary) hypertension: Secondary | ICD-10-CM | POA: Diagnosis not present

## 2015-10-24 DIAGNOSIS — E0840 Diabetes mellitus due to underlying condition with diabetic neuropathy, unspecified: Secondary | ICD-10-CM | POA: Diagnosis not present

## 2015-10-24 DIAGNOSIS — Z720 Tobacco use: Secondary | ICD-10-CM | POA: Diagnosis not present

## 2015-10-24 DIAGNOSIS — J411 Mucopurulent chronic bronchitis: Secondary | ICD-10-CM | POA: Diagnosis not present

## 2015-10-24 DIAGNOSIS — E1165 Type 2 diabetes mellitus with hyperglycemia: Secondary | ICD-10-CM | POA: Diagnosis not present

## 2015-10-24 DIAGNOSIS — E1140 Type 2 diabetes mellitus with diabetic neuropathy, unspecified: Secondary | ICD-10-CM | POA: Diagnosis not present

## 2015-10-24 DIAGNOSIS — E785 Hyperlipidemia, unspecified: Secondary | ICD-10-CM | POA: Diagnosis not present

## 2015-10-24 DIAGNOSIS — IMO0002 Reserved for concepts with insufficient information to code with codable children: Secondary | ICD-10-CM

## 2015-10-24 LAB — POCT UA - GLUCOSE/PROTEIN: Glucose, UA: 288

## 2015-10-24 LAB — POCT GLYCOSYLATED HEMOGLOBIN (HGB A1C): HEMOGLOBIN A1C: 7.9

## 2015-10-24 LAB — POCT UA - MICROALBUMIN: Microalbumin Ur, POC: 0 mg/L

## 2015-10-24 LAB — GLUCOSE, POCT (MANUAL RESULT ENTRY): POC Glucose: 288 mg/dl — AB (ref 70–99)

## 2015-10-24 MED ORDER — BUDESONIDE-FORMOTEROL FUMARATE 160-4.5 MCG/ACT IN AERO: 2.0000 | INHALATION_SPRAY | Freq: Two times a day (BID) | RESPIRATORY_TRACT | Status: DC

## 2015-10-24 MED ORDER — ALBUTEROL SULFATE HFA 108 (90 BASE) MCG/ACT IN AERS: 2.0000 | INHALATION_SPRAY | Freq: Four times a day (QID) | RESPIRATORY_TRACT | Status: DC | PRN

## 2015-10-24 MED ORDER — GLIMEPIRIDE 4 MG PO TABS: 8.0000 mg | ORAL_TABLET | Freq: Every day | ORAL | Status: DC

## 2015-10-24 MED ORDER — INSULIN PEN NEEDLE 32G X 4 MM MISC: 1.0000 | Status: DC

## 2015-10-24 MED ORDER — MELOXICAM 15 MG PO TABS: 15.0000 mg | ORAL_TABLET | Freq: Every day | ORAL | Status: DC

## 2015-10-24 MED ORDER — GLUCOSE BLOOD VI STRP: ORAL_STRIP | Status: DC

## 2015-10-24 NOTE — Patient Instructions (Signed)
Smoking Cessation, Tips for Success If you are ready to quit smoking, congratulations! You have chosen to help yourself be healthier. Cigarettes bring nicotine, tar, carbon monoxide, and other irritants into your body. Your lungs, heart, and blood vessels will be able to work better without these poisons. There are many different ways to quit smoking. Nicotine gum, nicotine patches, a nicotine inhaler, or nicotine nasal spray can help with physical craving. Hypnosis, support groups, and medicines help break the habit of smoking. WHAT THINGS CAN I DO TO MAKE QUITTING EASIER?  Here are some tips to help you quit for good:  Pick a date when you will quit smoking completely. Tell all of your friends and family about your plan to quit on that date.  Do not try to slowly cut down on the number of cigarettes you are smoking. Pick a quit date and quit smoking completely starting on that day.  Throw away all cigarettes.   Clean and remove all ashtrays from your home, work, and car.  On a card, write down your reasons for quitting. Carry the card with you and read it when you get the urge to smoke.  Cleanse your body of nicotine. Drink enough water and fluids to keep your urine clear or pale yellow. Do this after quitting to flush the nicotine from your body.  Learn to predict your moods. Do not let a bad situation be your excuse to have a cigarette. Some situations in your life might tempt you into wanting a cigarette.  Never have "just one" cigarette. It leads to wanting another and another. Remind yourself of your decision to quit.  Change habits associated with smoking. If you smoked while driving or when feeling stressed, try other activities to replace smoking. Stand up when drinking your coffee. Brush your teeth after eating. Sit in a different chair when you read the paper. Avoid alcohol while trying to quit, and try to drink fewer caffeinated beverages. Alcohol and caffeine may urge you to  smoke.  Avoid foods and drinks that can trigger a desire to smoke, such as sugary or spicy foods and alcohol.  Ask people who smoke not to smoke around you.  Have something planned to do right after eating or having a cup of coffee. For example, plan to take a walk or exercise.  Try a relaxation exercise to calm you down and decrease your stress. Remember, you may be tense and nervous for the first 2 weeks after you quit, but this will pass.  Find new activities to keep your hands busy. Play with a pen, coin, or rubber band. Doodle or draw things on paper.  Brush your teeth right after eating. This will help cut down on the craving for the taste of tobacco after meals. You can also try mouthwash.   Use oral substitutes in place of cigarettes. Try using lemon drops, carrots, cinnamon sticks, or chewing gum. Keep them handy so they are available when you have the urge to smoke.  When you have the urge to smoke, try deep breathing.  Designate your home as a nonsmoking area.  If you are a heavy smoker, ask your health care provider about a prescription for nicotine chewing gum. It can ease your withdrawal from nicotine.  Reward yourself. Set aside the cigarette money you save and buy yourself something nice.  Look for support from others. Join a support group or smoking cessation program. Ask someone at home or at work to help you with your plan   to quit smoking.  Always ask yourself, "Do I need this cigarette or is this just a reflex?" Tell yourself, "Today, I choose not to smoke," or "I do not want to smoke." You are reminding yourself of your decision to quit.  Do not replace cigarette smoking with electronic cigarettes (commonly called e-cigarettes). The safety of e-cigarettes is unknown, and some may contain harmful chemicals.  If you relapse, do not give up! Plan ahead and think about what you will do the next time you get the urge to smoke. HOW WILL I FEEL WHEN I QUIT SMOKING? You  may have symptoms of withdrawal because your body is used to nicotine (the addictive substance in cigarettes). You may crave cigarettes, be irritable, feel very hungry, cough often, get headaches, or have difficulty concentrating. The withdrawal symptoms are only temporary. They are strongest when you first quit but will go away within 10-14 days. When withdrawal symptoms occur, stay in control. Think about your reasons for quitting. Remind yourself that these are signs that your body is healing and getting used to being without cigarettes. Remember that withdrawal symptoms are easier to treat than the major diseases that smoking can cause.  Even after the withdrawal is over, expect periodic urges to smoke. However, these cravings are generally short lived and will go away whether you smoke or not. Do not smoke! WHAT RESOURCES ARE AVAILABLE TO HELP ME QUIT SMOKING? Your health care provider can direct you to community resources or hospitals for support, which may include:  Group support.  Education.  Hypnosis.  Therapy.   This information is not intended to replace advice given to you by your health care provider. Make sure you discuss any questions you have with your health care provider.   Document Released: 07/11/2004 Document Revised: 11/03/2014 Document Reviewed: 03/31/2013 Elsevier Interactive Patient Education 2016 [employer]vier Inc.  

## 2015-10-24 NOTE — Progress Notes (Signed)
Name: Gwendolyn StackMaritza Fuller   MRN: [id number]    DOB: October 03, 1961   Date:10/24/2015       Progress Note  Subjective  Chief Complaint  Chief Complaint  Patient presents with  . Diabetes    HPI  Diabetes  Patient presents for follow-up of diabetes which is present for over 5 years. Is currently on a regimen of Levemir and NovoLog sliding scale . Patient states somewhat compliant with their diet and exercise. There's been no hypoglycemic episodes and there no episodes of polyuria polydipsia polyphagia. [name] been in the low around - with a high around - . There is no end organ disease.  Last diabetic eye exam was -.   Last visit with dietitian was -. Last microalbumin was obtained 0 on today   Hypertension   Patient presents for follow-up of hypertension. It has been present for over 5 years.  Patient states that there is compliance with medical regimen which consists of lisinopril 10 mg daily . There is no end organ disease. Cardiac risk factors include hypertension hyperlipidemia and diabetes.  Exercise regimen consist of minimal walking .  Diet consist of some salt restriction .  Hyperlipidemia  Patient has a history of hyperlipidemia for over 5 years.  Current medical regimen consist of atorvastatin 40 mg daily at bedtime .  Compliance is good .  Diet and exercise are currently followed fairly well .  Risk factors for cardiovascular disease include hyperlipidemia diabetes tobacco abuse diabetes .   There have been no side effects from the medication.     COPD  Long-standing history of COPD for over 5 years. She continues to smoke despite suggestion of the contrary. She is currently been prescribed Provera. Takes only rare occasions. Past Medical History  Diagnosis Date  . Diabetes mellitus without complication (HCC)   . Arthritis     Social History  Substance Use Topics  . Smoking status: Current Every Day Smoker -- 1.00 packs/day    Types: Cigarettes  .  Smokeless tobacco: Never Used  . Alcohol Use: No     Current outpatient prescriptions:  .  atorvastatin (LIPITOR) 40 MG tablet, Take 1 tablet (40 mg total) by mouth daily., Disp: 90 tablet, Rfl: 3 .  clindamycin (CLEOCIN) 300 MG capsule, Take 1 capsule (300 mg total) by mouth 3 (three) times daily., Disp: 30 capsule, Rfl: 0 .  glimepiride (AMARYL) 4 MG tablet, Take 2 tablets (8 mg total) by mouth daily with breakfast., Disp: 60 tablet, Rfl: 5 .  glucose blood (ACCU-CHEK AVIVA PLUS) test strip, Use as instructed, Disp: 100 each, Rfl: 12 .  HYDROcodone-acetaminophen (NORCO) 5-325 MG tablet, Take 1 tablet by mouth every 8 (eight) hours as needed for moderate pain., Disp: 10 tablet, Rfl: 0 .  insulin detemir (LEVEMIR) 100 UNIT/ML injection, Inject 0.01 mLs (1 Units total) into the skin at bedtime., Disp: 10 mL, Rfl: 3 .  Insulin Pen Needle (UNIFINE PENTIPS) 32G X 4 MM MISC, 1 Package by Does not apply route 1 day or 1 dose., Disp: 30 each, Rfl: 3 .  lisinopril (PRINIVIL,ZESTRIL) 10 MG tablet, Take 1 tablet (10 mg total) by mouth daily., Disp: 90 tablet, Rfl: 3 .  magic mouthwash w/lidocaine SOLN, Take 5 mLs by mouth 3 (three) times daily., Disp: 5 mL, Rfl: 0 .  meloxicam (MOBIC) 15 MG tablet, Take 1 tablet (15 mg total) by mouth daily., Disp: 30 tablet, Rfl: 0  Current facility-administered medications:  .  cefTRIAXone (ROCEPHIN) injection  500 mg, 500 mg, Intramuscular, Once, Dennison Mascot, MD .  cefTRIAXone (ROCEPHIN) injection 500 mg, 500 mg, Intramuscular, Once, Dennison Mascot, MD .  dextromethorphan (DELSYM) 30 MG/5ML liquid 30 mg, 30 mg, Oral, BID, Dennison Mascot, MD .  insulin aspart (novoLOG) injection 10 Units, 10 Units, Subcutaneous, Once, Dennison Mascot, MD  No Known Allergies  Review of Systems  Constitutional: Negative for fever, chills and weight loss.  HENT: Negative for congestion, hearing loss, sore throat and tinnitus.   Eyes: Negative for blurred vision, double vision  and redness.  Respiratory: Positive for cough. Negative for hemoptysis and shortness of breath.   Cardiovascular: Negative for chest pain, palpitations, orthopnea, claudication and leg swelling.  Gastrointestinal: Positive for heartburn. Negative for nausea, vomiting, diarrhea, constipation and blood in stool.  Genitourinary: Negative for dysuria, urgency, frequency and hematuria.  Musculoskeletal: Positive for back pain. Negative for myalgias, joint pain, falls and neck pain.  Skin: Negative for itching.  Neurological: Positive for weakness. Negative for dizziness, tingling, tremors, focal weakness, seizures, loss of consciousness and headaches.  Endo/Heme/Allergies: Does not bruise/bleed easily.  Psychiatric/Behavioral: Positive for depression. Negative for substance abuse. The patient is not nervous/anxious and does not have insomnia.      Objective  Filed Vitals:   10/24/15 0947  BP: 124/68  Pulse: 104  Temp: 97.9 F (36.6 C)  Resp: 18  Height:  (1.626 m)  Weight: 143 lb 3 oz (64.949 kg)  SpO2: 97%     Physical Exam  Constitutional: [name], place, and time and well-developed, well-nourished, and in no distress.  HENT:  Head: Normocephalic.  Eyes: EOM are normal. Pupils are equal, round, and reactive to light.  Neck: Normal range of motion. No thyromegaly present.  Cardiovascular: Normal rate, regular rhythm and normal heart sounds.   No murmur heard. Pulmonary/Chest: Effort normal and breath sounds normal.  Abdominal: Soft. Bowel sounds are normal.  Musculoskeletal: Normal range of motion. She exhibits tenderness (lumbar arealumbar area). She exhibits no edema.  Neurological: She is alert and oriented to person, place, and time. No cranial nerve deficit. Gait normal.  Skin: Skin is warm and dry. No rash noted.  Psychiatric: Memory and affect normal.      Assessment & Plan  1. Diabetes mellitus due to underlying condition with diabetic  neuropathy, unspecified long term insulin use status (HCC) Improved but still not at goal - POCT HgB A1C - POCT Glucose (CBG) - POCT UA - Glucose/Protein - POCT UA - Microalbumin  2. Essential hypertension Well-controlled - Comprehensive metabolic panel  3. Mucopurulent chronic bronchitis (HCC) Persistent  4. Tobacco abuse Persistent  5. Hyperlipidemia Labs - Lipid panel - TSH - Lipid panel - TSH  6. Uncontrolled type 2 diabetes with neuropathy (HCC) Improved but not at goal - glucose blood (ACCU-CHEK AVIVA PLUS) test strip; Use as instructed  Dispense: 100 each; Refill: 12 - Insulin Pen Needle (UNIFINE PENTIPS) 32G X 4 MM MISC; 1 Package by Does not apply route 1 day or 1 dose.  Dispense: 30 each; Refill: 3

## 2015-11-05 DIAGNOSIS — E0840 Diabetes mellitus due to underlying condition with diabetic neuropathy, unspecified: Secondary | ICD-10-CM | POA: Insufficient documentation

## 2015-11-22 ENCOUNTER — Telehealth: Payer: Self-pay | Admitting: Family Medicine

## 2015-11-22 ENCOUNTER — Other Ambulatory Visit: Payer: Self-pay | Admitting: Family Medicine

## 2015-11-22 DIAGNOSIS — E119 Type 2 diabetes mellitus without complications: Secondary | ICD-10-CM

## 2015-11-22 DIAGNOSIS — Z0100 Encounter for examination of eyes and vision without abnormal findings: Principal | ICD-10-CM

## 2015-11-22 NOTE — Telephone Encounter (Signed)
Patient was told at last visit to get her diabetic eye exam due blurred vision and pain in the eye. Please place referral appointment to the closest eye doctor to her home.

## 2015-11-22 NOTE — Telephone Encounter (Signed)
Patient had to reschedule appointment for March because provider is out of the office. She is needing a new accu check meter along with the needles. She would like to larger (100 needles) called Unifine Pen Tips 4mm. Please send to [facility].

## 2015-11-23 ENCOUNTER — Ambulatory Visit: Payer: Medicaid Other | Admitting: Family Medicine

## 2015-11-29 MED ORDER — ACCU-CHEK SOFT TOUCH LANCETS MISC: Status: DC

## 2015-11-29 MED ORDER — ACCU-CHEK AVIVA DEVI: Status: AC

## 2015-11-29 NOTE — Telephone Encounter (Signed)
Glucometer and lancets sent to [facility]

## 2015-12-04 NOTE — Telephone Encounter (Signed)
Referral has been placed for annual diabetic eye exam

## 2015-12-04 NOTE — Addendum Note (Signed)
Addended by: Lelon Frohlich D on: 12/04/2015 09:16 AM   Modules accepted: Orders

## 2015-12-04 NOTE — Telephone Encounter (Signed)
Can you please order a referral for Opthmalogist so I can make this appt for this patient. Thanks

## 2015-12-26 ENCOUNTER — Telehealth: Payer: Self-pay | Admitting: Family Medicine

## 2015-12-26 DIAGNOSIS — IMO0002 Reserved for concepts with insufficient information to code with codable children: Secondary | ICD-10-CM

## 2015-12-26 DIAGNOSIS — E1140 Type 2 diabetes mellitus with diabetic neuropathy, unspecified: Secondary | ICD-10-CM

## 2015-12-26 DIAGNOSIS — E1165 Type 2 diabetes mellitus with hyperglycemia: Principal | ICD-10-CM

## 2015-12-26 NOTE — Telephone Encounter (Signed)
Patient had to resch appt for 02/14/16 due to doctor not being in office. She is requesting refill on Levemir and Glimepiride. Please send to [facility]

## 2015-12-27 ENCOUNTER — Ambulatory Visit: Payer: Medicaid Other | Admitting: Family Medicine

## 2015-12-28 MED ORDER — GLIMEPIRIDE 4 MG PO TABS: 8.0000 mg | ORAL_TABLET | Freq: Every day | ORAL | Status: DC

## 2015-12-28 MED ORDER — INSULIN DETEMIR 100 UNIT/ML ~~LOC~~ SOLN: 1.0000 [IU] | Freq: Every day | SUBCUTANEOUS | Status: DC

## 2016-01-14 ENCOUNTER — Other Ambulatory Visit: Payer: Self-pay | Admitting: Family Medicine

## 2016-01-15 ENCOUNTER — Other Ambulatory Visit: Payer: Self-pay | Admitting: Family Medicine

## 2016-01-15 MED ORDER — INSULIN DETEMIR 100 UNIT/ML FLEXPEN: 34.0000 [IU] | PEN_INJECTOR | Freq: Every day | SUBCUTANEOUS | Status: DC

## 2016-02-14 ENCOUNTER — Ambulatory Visit: Payer: Medicaid Other | Admitting: Family Medicine

## 2016-02-18 ENCOUNTER — Other Ambulatory Visit: Payer: Self-pay

## 2016-02-18 DIAGNOSIS — E1140 Type 2 diabetes mellitus with diabetic neuropathy, unspecified: Secondary | ICD-10-CM

## 2016-02-18 DIAGNOSIS — E1165 Type 2 diabetes mellitus with hyperglycemia: Principal | ICD-10-CM

## 2016-02-18 DIAGNOSIS — IMO0002 Reserved for concepts with insufficient information to code with codable children: Secondary | ICD-10-CM

## 2016-02-18 MED ORDER — GLUCOSE BLOOD VI STRP: ORAL_STRIP | Status: DC

## 2016-04-16 ENCOUNTER — Other Ambulatory Visit: Payer: Self-pay | Admitting: Family Medicine

## 2016-04-21 ENCOUNTER — Ambulatory Visit (INDEPENDENT_AMBULATORY_CARE_PROVIDER_SITE_OTHER): Payer: Medicaid Other | Admitting: Family Medicine

## 2016-04-21 ENCOUNTER — Encounter: Payer: Self-pay | Admitting: Family Medicine

## 2016-04-21 VITALS — BP 122/70 | HR 90 | Temp 97.9°F | Resp 16 | Ht 64.0 in | Wt 150.0 lb

## 2016-04-21 DIAGNOSIS — E785 Hyperlipidemia, unspecified: Secondary | ICD-10-CM

## 2016-04-21 DIAGNOSIS — I10 Essential (primary) hypertension: Secondary | ICD-10-CM

## 2016-04-21 DIAGNOSIS — E1165 Type 2 diabetes mellitus with hyperglycemia: Secondary | ICD-10-CM

## 2016-04-21 DIAGNOSIS — E559 Vitamin D deficiency, unspecified: Secondary | ICD-10-CM | POA: Diagnosis not present

## 2016-04-21 DIAGNOSIS — G5603 Carpal tunnel syndrome, bilateral upper limbs: Secondary | ICD-10-CM | POA: Diagnosis not present

## 2016-04-21 DIAGNOSIS — E1140 Type 2 diabetes mellitus with diabetic neuropathy, unspecified: Secondary | ICD-10-CM | POA: Diagnosis not present

## 2016-04-21 DIAGNOSIS — Z720 Tobacco use: Secondary | ICD-10-CM

## 2016-04-21 DIAGNOSIS — IMO0002 Reserved for concepts with insufficient information to code with codable children: Secondary | ICD-10-CM

## 2016-04-21 LAB — LIPID PANEL
Cholesterol: 260 mg/dL — ABNORMAL HIGH (ref 125–200)
HDL: 38 mg/dL — AB (ref >=46)
Total CHOL/HDL Ratio: 6.8 Ratio — ABNORMAL HIGH (ref <=5.0)
Triglycerides: 480 mg/dL — ABNORMAL HIGH (ref <150)

## 2016-04-21 LAB — COMPREHENSIVE METABOLIC PANEL
ALBUMIN: 4 g/dL (ref 3.6–5.1)
ALK PHOS: 112 U/L (ref 33–130)
ALT: 21 U/L (ref 6–29)
AST: 12 U/L (ref 10–35)
BILIRUBIN TOTAL: 0.6 mg/dL (ref 0.2–1.2)
BUN: 8 mg/dL (ref 7–25)
CALCIUM: 9.2 mg/dL (ref 8.6–10.4)
CO2: 26 mmol/L (ref 20–31)
Chloride: 102 mmol/L (ref 98–110)
Creat: 0.47 mg/dL — ABNORMAL LOW (ref 0.50–1.05)
Glucose, Bld: 273 mg/dL — ABNORMAL HIGH (ref 65–99)
Potassium: 4.1 mmol/L (ref 3.5–5.3)
Sodium: 137 mmol/L (ref 135–146)
TOTAL PROTEIN: 7.2 g/dL (ref 6.1–8.1)

## 2016-04-21 LAB — TSH: TSH: 0.79 mIU/L

## 2016-04-21 LAB — VITAMIN B12: Vitamin B-12: 721 pg/mL (ref 200–1100)

## 2016-04-21 LAB — GLUCOSE, POCT (MANUAL RESULT ENTRY): POC Glucose: 233 mg/dl — AB (ref 70–99)

## 2016-04-21 LAB — POCT GLYCOSYLATED HEMOGLOBIN (HGB A1C): Hemoglobin A1C: 10.1

## 2016-04-21 MED ORDER — BUDESONIDE-FORMOTEROL FUMARATE 160-4.5 MCG/ACT IN AERO: 2.0000 | INHALATION_SPRAY | Freq: Two times a day (BID) | RESPIRATORY_TRACT | Status: DC | PRN

## 2016-04-21 MED ORDER — GABAPENTIN 300 MG PO CAPS: 300.0000 mg | ORAL_CAPSULE | Freq: Every day | ORAL | Status: DC

## 2016-04-21 MED ORDER — INSULIN DETEMIR 100 UNIT/ML FLEXPEN: 40.0000 [IU] | PEN_INJECTOR | Freq: Every day | SUBCUTANEOUS | Status: DC

## 2016-04-21 NOTE — Patient Instructions (Addendum)
Stop smoking. Increase Levemir dose to 40 units daily. Stop meloxicam.

## 2016-04-22 DIAGNOSIS — E559 Vitamin D deficiency, unspecified: Secondary | ICD-10-CM | POA: Insufficient documentation

## 2016-04-22 LAB — VITAMIN D 25 HYDROXY (VIT D DEFICIENCY, FRACTURES): Vit D, 25-Hydroxy: 18 ng/mL — ABNORMAL LOW (ref 30–100)

## 2016-04-22 MED ORDER — VITAMIN D 50 MCG (2000 UT) PO CAPS: 1.0000 | ORAL_CAPSULE | Freq: Every day | ORAL | Status: DC

## 2016-04-22 NOTE — Addendum Note (Signed)
Addended by: Schuyler AmorPLONK, Oland Arquette on: 04/22/2016 03:25 PM   Modules accepted: Orders

## 2016-04-22 NOTE — Progress Notes (Signed)
Date:  04/21/2016   Name:  Gwendolyn Fuller   DOB:  1961/10/08   MRN:  [id number]  PCP:  [name], MD    Chief Complaint: Diabetes; Hyperlipidemia; and Hypertension   History of Present Illness:  This is a 55 y.o. female seen for six month f/u. Hx B CTS s/p surgery by Dr. Myra Rudehristopher Smith, has had several weeks of increasing B thumb numbness and pain as before, wants to see Dr. [name] again, requests med for pain. T2DM on Amaryl and Levemir 34 units daily (intolerant metformin), HLD on Lipitor, HTN on lisinopril, needs refill lancets, test strips, and needles. Unsure why she takes Mobic. Still smoking.  Review of Systems:  Review of Systems  Constitutional: Negative for fever and fatigue.  Respiratory: Negative for cough and shortness of breath.   Cardiovascular: Negative for chest pain and leg swelling.  Endocrine: Negative for polyuria.  Genitourinary: Negative for difficulty urinating.  Neurological: Negative for syncope and light-headedness.    Patient Active Problem List   Diagnosis Date Noted  . Carpal tunnel syndrome, bilateral 04/21/2016  . Diabetes mellitus due to underlying condition with diabetic neuropathy (HCC) 11/05/2015  . Cellulitis of oral soft tissues 09/28/2015  . Uncontrolled type 2 diabetes with neuropathy (HCC) 06/19/2015  . Essential hypertension 06/19/2015  . Hyperlipidemia 06/19/2015  . COLD (chronic obstructive lung disease) (HCC) 06/19/2015  . Tobacco abuse 06/19/2015    Prior to Admission medications   Medication Sig Start Date End Date Taking? Authorizing Provider  atorvastatin (LIPITOR) 40 MG tablet Take 1 tablet (40 mg total) by mouth daily. 06/19/15  Yes [name], MD  Blood Glucose Monitoring Suppl (ACCU-CHEK AVIVA) device Use as instructed 11/29/15 11/28/16 Yes [name], MD  budesonide-formoterol (SYMBICORT) 160-4.5 MCG/ACT inhaler Inhale 2 puffs into the lungs 2 (two) times daily as needed. 04/21/16  Yes Schuyler AmorWilliam Ren Grasse, MD   glimepiride (AMARYL) 4 MG tablet Take 2 tablets (8 mg total) by mouth daily with breakfast. 12/28/15  Yes [name], MD  glucose blood (ACCU-CHEK AVIVA PLUS) test strip Use as instructed to check blood sugars 2 times daily. 02/18/16  Yes [name], MD  Insulin Detemir (LEVEMIR FLEXTOUCH) 100 UNIT/ML Pen Inject 40 Units into the skin daily at 10 pm. 04/21/16  Yes Schuyler AmorWilliam Christne Platts, MD  Lancets (ACCU-CHEK SOFT TOUCH) lancets Use as instructed 11/29/15  Yes [name], MD  lisinopril (PRINIVIL,ZESTRIL) 10 MG tablet Take 1 tablet (10 mg total) by mouth daily. 06/19/15  Yes [name], MD  PROAIR HFA 108 (90 Base) MCG/ACT inhaler INHALE 2 PUFFS BY MOUTH 4 TIMES A DAY AS NEEDED. 11/23/15  Yes [name], MD  UNIFINE PENTIPS 32G X 4 MM MISC USE DAILY 01/16/16  Yes [name], MD  gabapentin (NEURONTIN) 300 MG capsule Take 1 capsule (300 mg total) by mouth at bedtime. 04/21/16   Schuyler AmorWilliam Maiya Kates, MD    No Known Allergies  Past Surgical History  Procedure Laterality Date  . Carpal tunnel release Bilateral     Social History  Substance Use Topics  . Smoking status: Current Every Day Smoker -- 1.00 packs/day    Types: Cigarettes  . Smokeless tobacco: Never Used  . Alcohol Use: No    History reviewed. No pertinent family history.  Medication list has been reviewed and updated.  Physical Examination: BP 122/70 mmHg  Pulse 90  Temp(Src) 97.9 F (36.6 C) (Oral)  Resp 16  Ht 5\' 4"  (1.626 m)  Wt 150 lb (68.04 kg)  BMI 25.73 kg/m2  SpO2 98%  Physical Exam  Constitutional: She appears well-developed and well-nourished.  Cardiovascular: Normal rate, regular rhythm and normal heart sounds.   Pulmonary/Chest: Effort normal and breath sounds normal.  Musculoskeletal: She exhibits no edema.  Neurological: She is alert.  Skin: Skin is warm and dry.  Psychiatric: She has a normal mood and affect. Her behavior is normal.  Nursing note and vitals reviewed.   Assessment and  Plan:  1. Carpal tunnel syndrome, bilateral Refer back Dr. [name] for re-eval, trial gabapentin 300 mg qhs for pain - Ambulatory referral to Orthopedic Surgery  2. Uncontrolled type 2 diabetes with neuropathy (HCC) A1c 10.1%, increase Levemir to 40 units daily, recommended d/c Amaryl, pt wishes to continue - POCT HgB A1C - POCT Glucose (CBG) - TSH - Vitamin D (25 hydroxy) - B12  3. Essential hypertension Well controlled on lisinopril - Comprehensive metabolic panel  4. Hyperlipidemia Unclear control on Lipitor - Lipid Profile  5. Tobacco abuse Strongly advised cessation  6. Med review D/c Mobic as unclear indication for use  Return in about 3 months (around 07/22/2016).  Dionne AnoWilliam M. Kingsley SpittlePlonk, Jr. MD [facility]  04/22/2016

## 2016-06-17 ENCOUNTER — Other Ambulatory Visit: Payer: Self-pay | Admitting: Family Medicine

## 2016-07-22 ENCOUNTER — Ambulatory Visit (INDEPENDENT_AMBULATORY_CARE_PROVIDER_SITE_OTHER): Payer: Medicaid Other | Admitting: Family Medicine

## 2016-07-22 ENCOUNTER — Encounter: Payer: Self-pay | Admitting: Family Medicine

## 2016-07-22 VITALS — BP 133/68 | HR 88 | Temp 98.0°F | Resp 16 | Ht 62.0 in | Wt 144.3 lb

## 2016-07-22 DIAGNOSIS — M5442 Lumbago with sciatica, left side: Secondary | ICD-10-CM

## 2016-07-22 DIAGNOSIS — E782 Mixed hyperlipidemia: Secondary | ICD-10-CM

## 2016-07-22 DIAGNOSIS — E1140 Type 2 diabetes mellitus with diabetic neuropathy, unspecified: Secondary | ICD-10-CM | POA: Diagnosis not present

## 2016-07-22 DIAGNOSIS — I10 Essential (primary) hypertension: Secondary | ICD-10-CM | POA: Diagnosis not present

## 2016-07-22 DIAGNOSIS — G8929 Other chronic pain: Secondary | ICD-10-CM

## 2016-07-22 DIAGNOSIS — M25531 Pain in right wrist: Secondary | ICD-10-CM

## 2016-07-22 DIAGNOSIS — M25532 Pain in left wrist: Secondary | ICD-10-CM | POA: Diagnosis not present

## 2016-07-22 DIAGNOSIS — J411 Mucopurulent chronic bronchitis: Secondary | ICD-10-CM

## 2016-07-22 DIAGNOSIS — E1165 Type 2 diabetes mellitus with hyperglycemia: Secondary | ICD-10-CM | POA: Diagnosis not present

## 2016-07-22 DIAGNOSIS — IMO0002 Reserved for concepts with insufficient information to code with codable children: Secondary | ICD-10-CM

## 2016-07-22 LAB — GLUCOSE, POCT (MANUAL RESULT ENTRY): POC Glucose: 298 mg/dl — AB (ref 70–99)

## 2016-07-22 LAB — POCT GLYCOSYLATED HEMOGLOBIN (HGB A1C): Hemoglobin A1C: 8.6

## 2016-07-22 MED ORDER — TIZANIDINE HCL 2 MG PO CAPS: 2.0000 mg | ORAL_CAPSULE | Freq: Three times a day (TID) | ORAL | 0 refills | Status: DC

## 2016-07-22 MED ORDER — ATORVASTATIN CALCIUM 40 MG PO TABS: 40.0000 mg | ORAL_TABLET | Freq: Every day | ORAL | 3 refills | Status: DC

## 2016-07-22 MED ORDER — GLIMEPIRIDE 4 MG PO TABS: 8.0000 mg | ORAL_TABLET | Freq: Every day | ORAL | 5 refills | Status: DC

## 2016-07-22 MED ORDER — GABAPENTIN 300 MG PO CAPS: 300.0000 mg | ORAL_CAPSULE | Freq: Every day | ORAL | 2 refills | Status: DC

## 2016-07-22 MED ORDER — BUDESONIDE-FORMOTEROL FUMARATE 160-4.5 MCG/ACT IN AERO: 2.0000 | INHALATION_SPRAY | Freq: Two times a day (BID) | RESPIRATORY_TRACT | 3 refills | Status: DC | PRN

## 2016-07-22 MED ORDER — SITAGLIPTIN PHOSPHATE 100 MG PO TABS: 100.0000 mg | ORAL_TABLET | Freq: Every day | ORAL | 0 refills | Status: DC

## 2016-07-22 MED ORDER — INSULIN DETEMIR 100 UNIT/ML FLEXPEN: 40.0000 [IU] | PEN_INJECTOR | Freq: Every day | SUBCUTANEOUS | 5 refills | Status: DC

## 2016-07-22 MED ORDER — LISINOPRIL 10 MG PO TABS: 10.0000 mg | ORAL_TABLET | Freq: Every day | ORAL | 3 refills | Status: DC

## 2016-07-22 NOTE — Progress Notes (Signed)
Name: Gwendolyn StackMaritza Fuller   MRN: [id number]    DOB: [birth year]   Date:07/22/2016       Progress Note  Subjective  Chief Complaint  Chief Complaint  Patient presents with  . Follow-up    3 mo  This patient is followed by Dr. Thana AtesMorrisey, new to me  Diabetes  She presents for her follow-up diabetic visit. She has type 2 diabetes mellitus. Her disease course has been improving. There are no hypoglycemic associated symptoms. Pertinent negatives for hypoglycemia include no headaches. Associated symptoms include polydipsia and polyuria. Pertinent negatives for diabetes include no blurred vision, no chest pain and no fatigue. Symptoms are stable. Diabetic complications include peripheral neuropathy. Pertinent negatives for diabetic complications include no CVA, heart disease or nephropathy. Current diabetic treatment includes oral agent (monotherapy) and intensive insulin program. She is compliant with treatment all of the time. She is following a diabetic diet. Her breakfast blood glucose range is generally >200 mg/dl.  Hypertension  This is a chronic problem. The problem is unchanged. The problem is controlled. Pertinent negatives include no blurred vision, chest pain, headaches, palpitations or shortness of breath. Past treatments include ACE inhibitors. There is no history of CVA.  Hyperlipidemia  This is a chronic problem. The problem is uncontrolled. Exacerbating diseases include diabetes. Pertinent negatives include no chest pain, myalgias or shortness of breath. Current antihyperlipidemic treatment includes statins.    Past Medical History:  Diagnosis Date  . Arthritis   . Diabetes mellitus without complication [facility](HCC)     Past Surgical History:  Procedure Laterality Date  . CARPAL TUNNEL RELEASE Bilateral     History reviewed. No pertinent family history.  Social History   Social History  . Marital status: Married    Spouse name: N/A  . Number of children: N/A  . Years of education:  N/A   Occupational History  . Not on file.   Social History Main Topics  . Smoking status: Current Every Day Smoker    Packs/day: 1.00    Types: Cigarettes  . Smokeless tobacco: Never Used  . Alcohol use No  . Drug use: No  . Sexual activity: Yes   Other Topics Concern  . Not on file   Social History Narrative  . No narrative on file     Current Outpatient Prescriptions:  .  atorvastatin (LIPITOR) 40 MG tablet, Take 1 tablet (40 mg total) by mouth daily., Disp: 90 tablet, Rfl: 3 .  Blood Glucose Monitoring Suppl (ACCU-CHEK AVIVA) device, Use as instructed, Disp: 1 each, Rfl: 0 .  budesonide-formoterol (SYMBICORT) 160-4.5 MCG/ACT inhaler, Inhale 2 puffs into the lungs 2 (two) times daily as needed., Disp: 1 Inhaler, Rfl: 3 .  gabapentin (NEURONTIN) 300 MG capsule, Take 1 capsule (300 mg total) by mouth at bedtime., Disp: 30 capsule, Rfl: 2 .  glimepiride (AMARYL) 4 MG tablet, Take 2 tablets (8 mg total) by mouth daily with breakfast., Disp: 60 tablet, Rfl: 5 .  glucose blood (ACCU-CHEK AVIVA PLUS) test strip, Use as instructed to check blood sugars 2 times daily., Disp: 100 each, Rfl: 12 .  Insulin Detemir (LEVEMIR FLEXTOUCH) 100 UNIT/ML Pen, Inject 40 Units into the skin daily at 10 pm., Disp: 15 mL, Rfl: 5 .  Lancets (ACCU-CHEK SOFT TOUCH) lancets, Use as instructed, Disp: 100 each, Rfl: 12 .  Lidocaine HCl 2 % SOLN, Use as directed in the mouth or throat., Disp: , Rfl:  .  lisinopril (PRINIVIL,ZESTRIL) 10 MG tablet, Take 1 tablet (10 mg total)  by mouth daily., Disp: 90 tablet, Rfl: 3 .  PROAIR HFA 108 (90 Base) MCG/ACT inhaler, INHALE 2 PUFFS BY MOUTH 4 TIMES A DAY AS NEEDED., Disp: 8.5 g, Rfl: 2 .  UNIFINE PENTIPS 32G X 4 MM MISC, USE DAILY, Disp: 30 each, Rfl: 12 .  Cholecalciferol (VITAMIN D) 2000 units CAPS, Take 1 capsule (2,000 Units total) by mouth daily. (Patient not taking: Reported on 07/22/2016), Disp: 30 capsule, Rfl:   Current Facility-Administered Medications:  .   cefTRIAXone (ROCEPHIN) injection 500 mg, 500 mg, Intramuscular, Once, Dennison Mascot, MD .  cefTRIAXone (ROCEPHIN) injection 500 mg, 500 mg, Intramuscular, Once, Dennison Mascot, MD .  dextromethorphan (DELSYM) 30 MG/5ML liquid 30 mg, 30 mg, Oral, BID, Dennison Mascot, MD .  insulin aspart (novoLOG) injection 10 Units, 10 Units, Subcutaneous, Once, Dennison Mascot, MD  No Known Allergies   Review of Systems  Constitutional: Negative for chills, fatigue and fever.  Eyes: Negative for blurred vision.  Respiratory: Negative for shortness of breath.   Cardiovascular: Negative for chest pain and palpitations.  Musculoskeletal: Positive for joint pain. Negative for myalgias.  Neurological: Negative for headaches.  Endo/Heme/Allergies: Positive for polydipsia.    Objective  Vitals:   07/22/16 1014  BP: 133/68  Pulse: 88  Resp: 16  Temp: 98 F (36.7 C)  TempSrc: Oral  SpO2: 97%  Weight: 144 lb 4.8 oz (65.5 kg)  Height: 5\' 2"  (1.575 m)    Physical Exam  Constitutional: [name], place, and time and well-developed, well-nourished, and in no distress.  HENT:  Head: Normocephalic and atraumatic.  Cardiovascular: Normal rate, regular rhythm, S1 normal and S2 normal.   No murmur heard. Pulmonary/Chest: Effort normal and breath sounds normal. She has no wheezes.  Abdominal: Soft. Bowel sounds are normal. There is no tenderness.  Musculoskeletal:       Right wrist: She exhibits decreased range of motion, tenderness and swelling.       Left wrist: She exhibits tenderness and swelling.       Right ankle: She exhibits no swelling.       Left ankle: She exhibits no swelling.       Lumbar back: She exhibits tenderness, pain and spasm.       Back:  Marked tenderness to palpation over the right wrist, limited ROM 2/2 pain  Neurological: She is alert and oriented to person, place, and time.  Nursing note and vitals reviewed.     Assessment & Plan 1. Essential  hypertension Blood pressure stable on ACE inhibitor therapy, continue. Refills provided - lisinopril (PRINIVIL,ZESTRIL) 10 MG tablet; Take 1 tablet (10 mg total) by mouth daily.  Dispense: 90 tablet; Refill:  2. Uncontrolled type 2 diabetes with neuropathy (HCC) Hemoglobin A1c is 8.6%, improved from 10.1% from June 2017. She is using Levemir 40 units, glimepiride 8 mg every morning. Will add Januvia 100 mg daily, goal A1c is less than 7% - sitaGLIPtin (JANUVIA) 100 MG tablet; Take 1 tablet (100 mg total) by mouth daily.  Dispense: 90 tablet; Refill: 0 - gabapentin (NEURONTIN) 300 MG capsule; Take 1 capsule (300 mg total) by mouth at bedtime.  Dispense: 30 capsule; Refill: 2 - Insulin Detemir (LEVEMIR FLEXTOUCH) 100 UNIT/ML Pen; Inject 40 Units into the skin daily at 10 pm.  Dispense: 15 mL; Refill: 5 - glimepiride (AMARYL) 4 MG tablet; Take 2 tablets (8 mg total) by mouth daily with breakfast.  Dispense: 60 tablet; Refill: 5  3. Hypercholesterolemia with hypertriglyceridemia Hypertriglyceridemia likely  from poorly controlled diabetes. Continue on atorvastatin, obtain FLP and follow-up - Lipid Profile - atorvastatin (LIPITOR) 40 MG tablet; Take 1 tablet (40 mg total) by mouth daily.  Dispense: 90 tablet; Refill: 3  4. Bilateral wrist pain Patient requesting referral back to hand specialist who performed carpal tunnel release surgeries, having worsening pain and swelling. - Ambulatory referral to Orthopedic Surgery  5. Mucopurulent chronic bronchitis (HCC)  - budesonide-formoterol (SYMBICORT) 160-4.5 MCG/ACT inhaler; Inhale 2 puffs into the lungs 2 (two) times daily as needed.  Dispense: 1 Inhaler; Refill: 3  6. Chronic left-sided low back pain with left-sided sciatica Patient has chronic left-sided low back pain, we will obtain an updated x-ray of lumbar spine, started on tizanidine for muscle relaxation. - tizanidine (ZANAFLEX) 2 MG capsule; Take 1 capsule (2 mg total) by mouth 3 (three)  times daily.  Dispense: 90 capsule; Refill: 0 - DG Lumbar Spine Complete; Future  Note: Total total face-to-face time: 40 minutes, greater than 50% was spent in counseling and coordination of care with the patient. This included assessing multiple chronic conditions in this patient who is new to me, ordering appropriate workup and medication management.  Juanantonio Stolar Asad A. [facility] 07/22/2016 10:44 AM

## 2016-07-23 ENCOUNTER — Telehealth: Payer: Self-pay

## 2016-07-23 NOTE — Telephone Encounter (Signed)
Pt was notified through detail voicemail about referral appointment to [facility].

## 2016-07-28 ENCOUNTER — Telehealth: Payer: Self-pay | Admitting: Family Medicine

## 2016-07-28 NOTE — Telephone Encounter (Signed)
Pt needs refill on Flex[facility]. Pt says she has enough for today and tomorrow. Please advise.

## 2016-07-28 NOTE — Telephone Encounter (Signed)
Please review patient's medication list, prescription for Levemir Flex touch has been sent to her pharmacy on 07/22/2016

## 2016-07-29 NOTE — Telephone Encounter (Signed)
LMOM to inform pt °

## 2016-07-31 ENCOUNTER — Telehealth: Payer: Self-pay | Admitting: Family Medicine

## 2016-07-31 NOTE — Telephone Encounter (Signed)
Returned call and left a voice message for patient, please advise patient to obtain a list of covered diabetes medications from her insurance provider and bring it to her office for review as soon as she can, we will select the one covered by her insurance. In the meantime, she should continue on Levemir and Glimepiride

## 2016-07-31 NOTE — Telephone Encounter (Signed)
Pt said that dr started her on Januvia but the pt has not started taking it and plans not to take it. Also pt said that the nurse called her and told her that the insurance ( m) denied the Januvia but needs to know what she can be placed on. Pt is crying and saying that she wants to keep taking the Levimir and does not want the dr to take her off of. Please give her a call for she is very upset. She is taking 2 pills in th morning of Glimetiride and at night she is taking 40mg  Levimire. Says can she just increase the Levimir. Pt said that she is not going to take the Januvia for insurance will not cover it

## 2016-08-01 NOTE — Telephone Encounter (Signed)
Both Levemir and Januvia have been approved.

## 2016-08-01 NOTE — Telephone Encounter (Signed)
Called the patient this AM and she is now telling me that the insurance is not covering her Levimir and needs use to get it approved. This is not the message that was taken yesterday. Tried to give her an appt for today ( Friday) and also one for Monday and she refused. Told her that I though that she needed to have a face to face with the dr so that this could get figured out for her. She is now calling her pharm and asking them to send us a approval request.

## 2016-10-24 ENCOUNTER — Ambulatory Visit: Payer: Medicaid Other | Admitting: Family Medicine

## 2017-01-21 ENCOUNTER — Telehealth: Payer: Self-pay | Admitting: Family Medicine

## 2017-01-21 NOTE — Telephone Encounter (Signed)
Pt has scheduled an appointment for 02-03-17 @ 9:20. She is completely out of 4 mm needles, levemir flex touch 100 units, and glimepiride 4 mg 60 tablets. She is asking that you please give enough to last until appointment. States that her current sugar level is 373. Please send to [facility]. You are completely booked until this appt and pt has been placed on the cancellation list.  [phone]

## 2017-01-22 ENCOUNTER — Other Ambulatory Visit: Payer: Self-pay | Admitting: Emergency Medicine

## 2017-01-22 DIAGNOSIS — IMO0002 Reserved for concepts with insufficient information to code with codable children: Secondary | ICD-10-CM

## 2017-01-22 DIAGNOSIS — E1140 Type 2 diabetes mellitus with diabetic neuropathy, unspecified: Secondary | ICD-10-CM

## 2017-01-22 DIAGNOSIS — E1165 Type 2 diabetes mellitus with hyperglycemia: Principal | ICD-10-CM

## 2017-01-22 MED ORDER — INSULIN DETEMIR 100 UNIT/ML FLEXPEN: 40.0000 [IU] | PEN_INJECTOR | Freq: Every day | SUBCUTANEOUS | 0 refills | Status: DC

## 2017-01-22 MED ORDER — GLIMEPIRIDE 4 MG PO TABS: 8.0000 mg | ORAL_TABLET | Freq: Every day | ORAL | 0 refills | Status: DC

## 2017-01-22 MED ORDER — UNIFINE PENTIPS 32G X 4 MM MISC: 12 refills | Status: DC

## 2017-01-22 NOTE — Telephone Encounter (Signed)
Per Myriam JacobsonHelen (nurse) prescriptions sent and pt informed to keep 02/03/17 appt

## 2017-02-03 ENCOUNTER — Ambulatory Visit (INDEPENDENT_AMBULATORY_CARE_PROVIDER_SITE_OTHER): Payer: Medicaid Other | Admitting: Family Medicine

## 2017-02-09 ENCOUNTER — Ambulatory Visit (INDEPENDENT_AMBULATORY_CARE_PROVIDER_SITE_OTHER): Payer: Medicaid Other | Admitting: Family Medicine

## 2017-02-09 ENCOUNTER — Encounter: Payer: Self-pay | Admitting: Family Medicine

## 2017-02-09 VITALS — BP 122/74 | HR 106 | Temp 98.0°F | Resp 16 | Ht 62.0 in | Wt 150.9 lb

## 2017-02-09 DIAGNOSIS — E1165 Type 2 diabetes mellitus with hyperglycemia: Secondary | ICD-10-CM | POA: Diagnosis not present

## 2017-02-09 DIAGNOSIS — E1140 Type 2 diabetes mellitus with diabetic neuropathy, unspecified: Secondary | ICD-10-CM

## 2017-02-09 DIAGNOSIS — E7800 Pure hypercholesterolemia, unspecified: Secondary | ICD-10-CM

## 2017-02-09 DIAGNOSIS — J411 Mucopurulent chronic bronchitis: Secondary | ICD-10-CM

## 2017-02-09 DIAGNOSIS — IMO0002 Reserved for concepts with insufficient information to code with codable children: Secondary | ICD-10-CM

## 2017-02-09 DIAGNOSIS — I10 Essential (primary) hypertension: Secondary | ICD-10-CM | POA: Diagnosis not present

## 2017-02-09 LAB — COMPLETE METABOLIC PANEL WITH GFR
ALT: 23 U/L (ref 6–29)
AST: 13 U/L (ref 10–35)
Albumin: 4.1 g/dL (ref 3.6–5.1)
Alkaline Phosphatase: 125 U/L (ref 33–130)
BUN: 17 mg/dL (ref 7–25)
CHLORIDE: 101 mmol/L (ref 98–110)
CO2: 24 mmol/L (ref 20–31)
Calcium: 9.4 mg/dL (ref 8.6–10.4)
Creat: 0.7 mg/dL (ref 0.50–1.05)
Glucose, Bld: 337 mg/dL — ABNORMAL HIGH (ref 65–99)
Potassium: 4.2 mmol/L (ref 3.5–5.3)
Sodium: 136 mmol/L (ref 135–146)
Total Bilirubin: 0.3 mg/dL (ref 0.2–1.2)
Total Protein: 7.3 g/dL (ref 6.1–8.1)

## 2017-02-09 LAB — LIPID PANEL
CHOL/HDL RATIO: 7.8 ratio — AB (ref <5.0)
Cholesterol: 265 mg/dL — ABNORMAL HIGH (ref <200)
HDL: 34 mg/dL — AB (ref >50)
Triglycerides: 592 mg/dL — ABNORMAL HIGH (ref <150)

## 2017-02-09 LAB — POCT GLYCOSYLATED HEMOGLOBIN (HGB A1C): HEMOGLOBIN A1C: 9.1

## 2017-02-09 LAB — GLUCOSE, POCT (MANUAL RESULT ENTRY): POC Glucose: 288 mg/dl — AB (ref 70–99)

## 2017-02-09 MED ORDER — ALBUTEROL SULFATE HFA 108 (90 BASE) MCG/ACT IN AERS: INHALATION_SPRAY | RESPIRATORY_TRACT | 2 refills | Status: DC

## 2017-02-09 MED ORDER — INSULIN DEGLUDEC 100 UNIT/ML ~~LOC~~ SOPN: 30.0000 [IU] | PEN_INJECTOR | Freq: Every day | SUBCUTANEOUS | 0 refills | Status: DC

## 2017-02-09 MED ORDER — BUDESONIDE-FORMOTEROL FUMARATE 160-4.5 MCG/ACT IN AERO: 2.0000 | INHALATION_SPRAY | Freq: Two times a day (BID) | RESPIRATORY_TRACT | 3 refills | Status: DC

## 2017-02-09 MED ORDER — SITAGLIPTIN PHOS-METFORMIN HCL 50-500 MG PO TABS: 1.0000 | ORAL_TABLET | Freq: Two times a day (BID) | ORAL | 0 refills | Status: DC

## 2017-02-09 MED ORDER — GABAPENTIN 300 MG PO CAPS: 300.0000 mg | ORAL_CAPSULE | Freq: Every day | ORAL | 0 refills | Status: DC

## 2017-02-09 NOTE — Progress Notes (Signed)
Name: Gwendolyn Fuller   MRN: [id number]    DOB: Apr 13, 1961   Date:02/09/2017       Progress Note  Subjective  Chief Complaint  Chief Complaint  Patient presents with  . Diabetes    follow up  . Hyperlipidemia  . Hypertension    Diabetes  She presents for her follow-up diabetic visit. She has type 2 diabetes mellitus. Her disease course has been worsening. There are no hypoglycemic associated symptoms. Pertinent negatives for hypoglycemia include no headaches. Associated symptoms include blurred vision (eyes have been 'irritated a lot'), fatigue, foot paresthesias, polydipsia and polyuria. Pertinent negatives for diabetes include no chest pain. Symptoms are worsening. Diabetic complications include peripheral neuropathy. Pertinent negatives for diabetic complications include no CVA or heart disease. Current diabetic treatment includes intensive insulin program and oral agent (monotherapy). She is following a diabetic diet. She monitors blood glucose at home 3-4 x per day. Her breakfast blood glucose range is generally >200 mg/dl. An ACE inhibitor/angiotensin II receptor blocker is being taken.  Hyperlipidemia  This is a chronic problem. The problem is uncontrolled. Recent lipid tests were reviewed and are high (elevated total cholesterol and Triglycerides). Pertinent negatives include no chest pain, leg pain, myalgias or shortness of breath. Current antihyperlipidemic treatment includes statins.  Hypertension  This is a chronic problem. The problem is unchanged. The problem is controlled. Associated symptoms include blurred vision (eyes have been 'irritated a lot'). Pertinent negatives include no chest pain, headaches, palpitations or shortness of breath. Past treatments include ACE inhibitors. There is no history of kidney disease, CAD/MI or CVA.   COPD: Pt. Has COPD, first diagnosed 2 years ago, has frequent coughing, shortness of breath especially with activity. She used to smoke 1 PPD, quit 4  months ago. Now takes Albuterl inhaler for rescue and Symbicort for maintenance. These inhalers seem to work well, no side effects  Past Medical History:  Diagnosis Date  . Arthritis   . Diabetes mellitus without complication [facility])     Past Surgical History:  Procedure Laterality Date  . CARPAL TUNNEL RELEASE Bilateral     No family history on file.  Social History   Social History  . Marital status: Married    Spouse name: N/A  . Number of children: N/A  . Years of education: N/A   Occupational History  . Not on file.   Social History Main Topics  . Smoking status: Current Every Day Smoker    Packs/day: 1.00    Types: Cigarettes  . Smokeless tobacco: Never Used  . Alcohol use No  . Drug use: No  . Sexual activity: Yes   Other Topics Concern  . Not on file   Social History Narrative  . No narrative on file     Current Outpatient Prescriptions:  .  atorvastatin (LIPITOR) 40 MG tablet, Take 1 tablet (40 mg total) by mouth daily., Disp: 90 tablet, Rfl: 3 .  budesonide-formoterol (SYMBICORT) 160-4.5 MCG/ACT inhaler, Inhale 2 puffs into the lungs 2 (two) times daily as needed., Disp: 1 Inhaler, Rfl: 3 .  Cholecalciferol (VITAMIN D) 2000 units CAPS, Take 1 capsule (2,000 Units total) by mouth daily., Disp: 30 capsule, Rfl:  .  gabapentin (NEURONTIN) 300 MG capsule, Take 1 capsule (300 mg total) by mouth at bedtime., Disp: 30 capsule, Rfl: 2 .  glimepiride (AMARYL) 4 MG tablet, Take 2 tablets (8 mg total) by mouth daily with breakfast., Disp: 60 tablet, Rfl: 0 .  glucose blood (ACCU-CHEK AVIVA PLUS) test strip,  Use as instructed to check blood sugars 2 times daily., Disp: 100 each, Rfl: 12 .  Insulin Detemir (LEVEMIR FLEXTOUCH) 100 UNIT/ML Pen, Inject 40 Units into the skin daily at 10 pm., Disp: 15 mL, Rfl: 0 .  Lancets (ACCU-CHEK SOFT TOUCH) lancets, Use as instructed, Disp: 100 each, Rfl: 12 .  Lidocaine HCl 2 % SOLN, Use as directed in the mouth or throat., Disp: ,  Rfl:  .  lisinopril (PRINIVIL,ZESTRIL) 10 MG tablet, Take 1 tablet (10 mg total) by mouth daily., Disp: 90 tablet, Rfl: 3 .  PROAIR HFA 108 (90 Base) MCG/ACT inhaler, INHALE 2 PUFFS BY MOUTH 4 TIMES A DAY AS NEEDED., Disp: 8.5 g, Rfl: 2 .  sitaGLIPtin (JANUVIA) 100 MG tablet, Take 1 tablet (100 mg total) by mouth daily., Disp: 90 tablet, Rfl: 0 .  tizanidine (ZANAFLEX) 2 MG capsule, Take 1 capsule (2 mg total) by mouth 3 (three) times daily., Disp: 90 capsule, Rfl: 0 .  UNIFINE PENTIPS 32G X 4 MM MISC, USE DAILY, Disp: 30 each, Rfl: 12  Current Facility-Administered Medications:  .  cefTRIAXone (ROCEPHIN) injection 500 mg, 500 mg, Intramuscular, Once, Dennison Mascot, MD .  cefTRIAXone (ROCEPHIN) injection 500 mg, 500 mg, Intramuscular, Once, Dennison Mascot, MD .  dextromethorphan (DELSYM) 30 MG/5ML liquid 30 mg, 30 mg, Oral, BID, Dennison Mascot, MD .  insulin aspart (novoLOG) injection 10 Units, 10 Units, Subcutaneous, Once, Dennison Mascot, MD  No Known Allergies   Review of Systems  Constitutional: Positive for fatigue.  Eyes: Positive for blurred vision (eyes have been 'irritated a lot').  Respiratory: Negative for shortness of breath.   Cardiovascular: Negative for chest pain and palpitations.  Musculoskeletal: Negative for myalgias.  Neurological: Negative for headaches.  Endo/Heme/Allergies: Positive for polydipsia.      Objective  Vitals:   02/09/17 1006  BP: 122/74  Pulse: (!) 106  Resp: 16  Temp: 98 F (36.7 C)  TempSrc: Oral  SpO2: 95%  Weight: 150 lb 14.4 oz (68.4 kg)  Height:  (1.575 m)    Physical Exam  Constitutional: [name], place, and time and well-developed, well-nourished, and in no distress.  HENT:  Head: Normocephalic.  Cardiovascular: Normal rate, regular rhythm and normal heart sounds.   No murmur heard. Pulmonary/Chest: Effort normal and breath sounds normal. She has no wheezes.  Abdominal: Soft. Bowel sounds are  normal.  Musculoskeletal: She exhibits no edema.  Neurological: She is alert and oriented to person, place, and time.  Psychiatric: Mood, memory, affect and judgment normal.  Nursing note and vitals reviewed.     Recent Results (from the past 2160 hour(s))  POCT HgB A1C     Status: None   Collection Time: 02/09/17 10:12 AM  Result Value Ref Range   Hemoglobin A1C 9.1   POCT Glucose (CBG)     Status: Abnormal   Collection Time: 02/09/17 10:13 AM  Result Value Ref Range   POC Glucose 288 (A) 70 - 99 mg/dl    Assessment & Plan  1. Uncontrolled type 2 diabetes with neuropathy (HCC)   A1c is 9.1%, poorly controlled diabetes, DC Levemir and glimepiride, started on Tresiba and Janumet - POCT HgB A1C - POCT Glucose (CBG) - sitaGLIPtin-metformin (JANUMET) 50-500 MG tablet; Take 1 tablet by mouth 2 (two) times daily with a meal.  Dispense: 180 tablet; Refill: 0 - Urine Microalbumin w/creat. ratio - insulin degludec (TRESIBA FLEXTOUCH) 100 UNIT/ML SOPN FlexTouch Pen; Inject 0.3 mLs (30 Units total) into  the skin daily at 10 pm.  Dispense: 27 mL; Refill: 0 - gabapentin (NEURONTIN) 300 MG capsule; Take 1 capsule (300 mg total) by mouth at bedtime.  Dispense: 90 capsule; Refill: 0 - Ambulatory referral to Ophthalmology  2. Essential hypertension BP stable on present anti- hypertensive therapy  3. Pure hypercholesterolemia  - Lipid Profile - COMPLETE METABOLIC PANEL WITH GFR  4. Mucopurulent chronic bronchitis (HCC)  - budesonide-formoterol (SYMBICORT) 160-4.5 MCG/ACT inhaler; Inhale 2 puffs into the lungs 2 (two) times daily.  Dispense: 3 Inhaler; Refill: 3 - albuterol (PROAIR HFA) 108 (90 Base) MCG/ACT inhaler; INHALE 2 PUFFS BY MOUTH 4 TIMES A DAY AS NEEDED.  Dispense: 8.5 g; Refill: 2   Stiven Kaspar Asad [facility] 02/09/2017 10:18 AM

## 2017-02-10 LAB — MICROALBUMIN / CREATININE URINE RATIO
Creatinine, Urine: 109 mg/dL (ref 20–320)
MICROALB UR: 1.4 mg/dL
MICROALB/CREAT RATIO: 13 ug/mg{creat} (ref <30)

## 2017-02-12 ENCOUNTER — Telehealth: Payer: Self-pay | Admitting: Family Medicine

## 2017-02-12 NOTE — Telephone Encounter (Signed)
Pt would like a call back

## 2017-02-13 NOTE — Telephone Encounter (Signed)
Returned patient call, left voicemail asking patient to call back

## 2017-02-26 ENCOUNTER — Other Ambulatory Visit: Payer: Self-pay | Admitting: Family Medicine

## 2017-02-26 DIAGNOSIS — E1140 Type 2 diabetes mellitus with diabetic neuropathy, unspecified: Secondary | ICD-10-CM

## 2017-02-26 DIAGNOSIS — E1165 Type 2 diabetes mellitus with hyperglycemia: Principal | ICD-10-CM

## 2017-02-26 DIAGNOSIS — IMO0002 Reserved for concepts with insufficient information to code with codable children: Secondary | ICD-10-CM

## 2017-02-27 ENCOUNTER — Other Ambulatory Visit: Payer: Self-pay | Admitting: Family Medicine

## 2017-02-27 DIAGNOSIS — IMO0002 Reserved for concepts with insufficient information to code with codable children: Secondary | ICD-10-CM

## 2017-02-27 DIAGNOSIS — E1165 Type 2 diabetes mellitus with hyperglycemia: Principal | ICD-10-CM

## 2017-02-27 DIAGNOSIS — E1140 Type 2 diabetes mellitus with diabetic neuropathy, unspecified: Secondary | ICD-10-CM

## 2017-03-02 ENCOUNTER — Telehealth: Payer: Self-pay

## 2017-03-02 DIAGNOSIS — E1165 Type 2 diabetes mellitus with hyperglycemia: Principal | ICD-10-CM

## 2017-03-02 DIAGNOSIS — IMO0002 Reserved for concepts with insufficient information to code with codable children: Secondary | ICD-10-CM

## 2017-03-02 DIAGNOSIS — E1140 Type 2 diabetes mellitus with diabetic neuropathy, unspecified: Secondary | ICD-10-CM

## 2017-03-02 MED ORDER — GLUCOSE BLOOD VI STRP: ORAL_STRIP | 0 refills | Status: DC

## 2017-03-02 NOTE — Telephone Encounter (Signed)
Medication has been refilled and sent to [facility] 

## 2017-03-25 ENCOUNTER — Ambulatory Visit (INDEPENDENT_AMBULATORY_CARE_PROVIDER_SITE_OTHER): Payer: Medicaid Other | Admitting: Family Medicine

## 2017-03-25 ENCOUNTER — Encounter: Payer: Self-pay | Admitting: Family Medicine

## 2017-03-25 ENCOUNTER — Other Ambulatory Visit: Payer: Self-pay | Admitting: Family Medicine

## 2017-03-25 VITALS — BP 122/78 | HR 99 | Temp 98.0°F | Resp 16 | Ht 62.0 in | Wt 148.6 lb

## 2017-03-25 DIAGNOSIS — Z1159 Encounter for screening for other viral diseases: Secondary | ICD-10-CM

## 2017-03-25 DIAGNOSIS — E1140 Type 2 diabetes mellitus with diabetic neuropathy, unspecified: Secondary | ICD-10-CM

## 2017-03-25 DIAGNOSIS — Z01419 Encounter for gynecological examination (general) (routine) without abnormal findings: Secondary | ICD-10-CM

## 2017-03-25 DIAGNOSIS — E1165 Type 2 diabetes mellitus with hyperglycemia: Secondary | ICD-10-CM | POA: Diagnosis not present

## 2017-03-25 DIAGNOSIS — IMO0002 Reserved for concepts with insufficient information to code with codable children: Secondary | ICD-10-CM

## 2017-03-25 DIAGNOSIS — Z1231 Encounter for screening mammogram for malignant neoplasm of breast: Secondary | ICD-10-CM | POA: Diagnosis not present

## 2017-03-25 DIAGNOSIS — Z1211 Encounter for screening for malignant neoplasm of colon: Secondary | ICD-10-CM

## 2017-03-25 DIAGNOSIS — Z124 Encounter for screening for malignant neoplasm of cervix: Secondary | ICD-10-CM | POA: Diagnosis not present

## 2017-03-25 DIAGNOSIS — Z1239 Encounter for other screening for malignant neoplasm of breast: Secondary | ICD-10-CM

## 2017-03-25 DIAGNOSIS — Z13820 Encounter for screening for osteoporosis: Secondary | ICD-10-CM

## 2017-03-25 DIAGNOSIS — Z0000 Encounter for general adult medical examination without abnormal findings: Secondary | ICD-10-CM

## 2017-03-25 LAB — CBC WITH DIFFERENTIAL/PLATELET
BASOS ABS: 0 {cells}/uL (ref 0–200)
Basophils Relative: 0 %
EOS ABS: 90 {cells}/uL (ref 15–500)
Eosinophils Relative: 1 %
HEMATOCRIT: 44.2 % (ref 35.0–45.0)
Hemoglobin: 14.7 g/dL (ref 11.7–15.5)
Lymphocytes Relative: 32 %
Lymphs Abs: 2880 cells/uL (ref 850–3900)
MCH: 29.3 pg (ref 27.0–33.0)
MCHC: 33.3 g/dL (ref 32.0–36.0)
MCV: 88.2 fL (ref 80.0–100.0)
MONO ABS: 450 {cells}/uL (ref 200–950)
MONOS PCT: 5 %
MPV: 10.2 fL (ref 7.5–12.5)
Neutro Abs: 5580 cells/uL (ref 1500–7800)
Neutrophils Relative %: 62 %
Platelets: 292 10*3/uL (ref 140–400)
RBC: 5.01 MIL/uL (ref 3.80–5.10)
RDW: 13.9 % (ref 11.0–15.0)
WBC: 9 10*3/uL (ref 3.8–10.8)

## 2017-03-25 LAB — TSH: TSH: 1.6 m[IU]/L

## 2017-03-25 MED ORDER — GABAPENTIN 300 MG PO CAPS: 300.0000 mg | ORAL_CAPSULE | Freq: Every day | ORAL | 0 refills | Status: DC

## 2017-03-25 MED ORDER — INSULIN DEGLUDEC 100 UNIT/ML ~~LOC~~ SOPN: 40.0000 [IU] | PEN_INJECTOR | Freq: Every day | SUBCUTANEOUS | 0 refills | Status: DC

## 2017-03-25 MED ORDER — SITAGLIPTIN PHOS-METFORMIN HCL 50-1000 MG PO TABS: 1.0000 | ORAL_TABLET | Freq: Two times a day (BID) | ORAL | 0 refills | Status: DC

## 2017-03-25 NOTE — Progress Notes (Signed)
Name: Gwendolyn Fuller   MRN: [id number]    DOB: 29-Mar-1961   Date:03/25/2017       Progress Note  Subjective  Chief Complaint  Chief Complaint  Patient presents with  . Annual Exam    CPE    Diabetes  She presents for her follow-up diabetic visit. She has type 2 diabetes mellitus. Her disease course has been worsening. Hypoglycemia symptoms include dizziness and headaches. Pertinent negatives for hypoglycemia include no nervousness/anxiousness. Associated symptoms include blurred vision (occasional blurred vision) and fatigue. Pertinent negatives for diabetes include no chest pain. There are no hypoglycemic complications. Diabetic complications include peripheral neuropathy. Current diabetic treatment includes oral agent (dual therapy) and intensive insulin program. Her weight is stable. She is following a diabetic and generally healthy diet. She has had a previous visit with a dietitian. She monitors blood glucose at home 5+ x per day. Her breakfast blood glucose range is generally >200 mg/dl. Her lunch blood glucose range is generally >200 mg/dl. Her dinner blood glucose range is generally >200 mg/dl. (Over 250 mg/dL, in the morning) An ACE inhibitor/angiotensin II receptor blocker is being taken.    Pt. Presents for Complete Physical Exam.  She is due for cervical, colon, and breast cancer screening. Will also need DEXA Scan.  She will also need Hepatitis C screening  Past Medical History:  Diagnosis Date  . Arthritis   . Diabetes mellitus without complication [facility])     Past Surgical History:  Procedure Laterality Date  . CARPAL TUNNEL RELEASE Bilateral     History reviewed. No pertinent family history.  Social History   Social History  . Marital status: Married    Spouse name: N/A  . Number of children: N/A  . Years of education: N/A   Occupational History  . Not on file.   Social History Main Topics  . Smoking status: Current Every Day Smoker    Packs/day: 1.00   Types: Cigarettes  . Smokeless tobacco: Never Used  . Alcohol use No  . Drug use: No  . Sexual activity: Yes   Other Topics Concern  . Not on file   Social History Narrative  . No narrative on file     Current Outpatient Prescriptions:  .  albuterol (PROAIR HFA) 108 (90 Base) MCG/ACT inhaler, INHALE 2 PUFFS BY MOUTH 4 TIMES A DAY AS NEEDED., Disp: 8.5 g, Rfl: 2 .  atorvastatin (LIPITOR) 40 MG tablet, Take 1 tablet (40 mg total) by mouth daily., Disp: 90 tablet, Rfl: 3 .  budesonide-formoterol (SYMBICORT) 160-4.5 MCG/ACT inhaler, Inhale 2 puffs into the lungs 2 (two) times daily., Disp: 3 Inhaler, Rfl: 3 .  Cholecalciferol (VITAMIN D) 2000 units CAPS, Take 1 capsule (2,000 Units total) by mouth daily., Disp: 30 capsule, Rfl:  .  gabapentin (NEURONTIN) 300 MG capsule, Take 1 capsule (300 mg total) by mouth at bedtime., Disp: 90 capsule, Rfl: 0 .  glucose blood (ACCU-CHEK AVIVA PLUS) test strip, Use as instructed to check blood sugars 2 times daily., Disp: 180 each, Rfl: 0 .  insulin degludec (TRESIBA FLEXTOUCH) 100 UNIT/ML SOPN FlexTouch Pen, Inject 0.3 mLs (30 Units total) into the skin daily at 10 pm., Disp: 27 mL, Rfl: 0 .  Insulin Detemir (LEVEMIR FLEXTOUCH) 100 UNIT/ML Pen, Inject 40 Units into the skin daily at 10 pm., Disp: 15 mL, Rfl: 0 .  Lancets (ACCU-CHEK SOFT TOUCH) lancets, Use as instructed, Disp: 100 each, Rfl: 12 .  Lidocaine HCl 2 % SOLN, Use as directed in  the mouth or throat., Disp: , Rfl:  .  lisinopril (PRINIVIL,ZESTRIL) 10 MG tablet, Take 1 tablet (10 mg total) by mouth daily., Disp: 90 tablet, Rfl: 3 .  sitaGLIPtin-metformin (JANUMET) 50-500 MG tablet, Take 1 tablet by mouth 2 (two) times daily with a meal., Disp: 180 tablet, Rfl: 0 .  tizanidine (ZANAFLEX) 2 MG capsule, Take 1 capsule (2 mg total) by mouth 3 (three) times daily., Disp: 90 capsule, Rfl: 0 .  UNIFINE PENTIPS 32G X 4 MM MISC, USE DAILY, Disp: 30 each, Rfl: 12  Current Facility-Administered  Medications:  .  cefTRIAXone (ROCEPHIN) injection 500 mg, 500 mg, Intramuscular, Once, Morrisey, Lemont, MD .  cefTRIAXone (ROCEPHIN) injection 500 mg, 500 mg, Intramuscular, Once, Dennison Mascot, MD .  dextromethorphan (DELSYM) 30 MG/5ML liquid 30 mg, 30 mg, Oral, BID, Morrisey, Lemont, MD .  insulin aspart (novoLOG) injection 10 Units, 10 Units, Subcutaneous, Once, Dennison Mascot, MD  No Known Allergies   Review of Systems  Constitutional: Positive for fatigue and malaise/fatigue. Negative for chills and fever.  HENT: Negative for congestion, ear pain and sore throat.   Eyes: Positive for blurred vision (occasional blurred vision). Negative for double vision.  Respiratory: Positive for cough. Negative for sputum production and shortness of breath.   Cardiovascular: Positive for leg swelling (left leg swelling on occasion.). Negative for chest pain.  Gastrointestinal: Negative for abdominal pain, blood in stool, constipation, diarrhea, nausea and vomiting.  Genitourinary: Negative for dysuria and hematuria.  Musculoskeletal: Positive for back pain. Negative for joint pain.  Neurological: Positive for dizziness and headaches.  Psychiatric/Behavioral: Negative for depression. The patient is not nervous/anxious and does not have insomnia.     Objective  Vitals:   03/25/17 0856  BP: 122/78  Pulse: 99  Resp: 16  Temp: 98 F (36.7 C)  TempSrc: Oral  SpO2: 96%  Weight: 148 lb 9.6 oz (67.4 kg)  Height: 5\' 2"  (1.575 m)    Physical Exam  Constitutional: [name], place, and time and well-developed, well-nourished, and in no distress.  HENT:  Head: Normocephalic and atraumatic.  Right Ear: External ear normal.  Left Ear: External ear normal.  Mouth/Throat: Oropharynx is clear and moist.  Eyes: Pupils are equal, round, and reactive to light.  Neck: Neck supple. No thyromegaly present.  Cardiovascular: Normal rate, regular rhythm and normal heart sounds.   No  murmur heard. Pulmonary/Chest: Effort normal and breath sounds normal. She has no wheezes.  Abdominal: Soft. Bowel sounds are normal. There is no tenderness.  Genitourinary: Vagina normal, uterus normal, cervix normal, right adnexa normal and left adnexa normal.  Musculoskeletal: She exhibits no edema.  Neurological: She is alert and oriented to person, place, and time.  Skin: Skin is warm and dry.  Psychiatric: Mood, memory, affect and judgment normal.  Nursing note and vitals reviewed.     Assessment & Plan  1. Well woman exam with routine gynecological exam  - TSH - VITAMIN D 25 Hydroxy (Vit-D Deficiency, Fractures) - CBC with Differential/Platelet  2. Screening for cervical cancer  - Pap IG and HPV (high risk) DNA detection  3. Screening for breast cancer  - MM Digital Screening; Future  4. Screening for colon cancer  - Cologuard  5. Screening for osteoporosis  - DG Bone Density; Future  6. Need for hepatitis C screening test  - Hepatitis C antibody  7. Uncontrolled type 2 diabetes with neuropathy (HCC) Both fasting and postprandial blood glucose levels are elevated, increase metformin  to 1000 mg twice a day, advised to increase to see by to 40 units daily by going up 1-2 units every 3-4 days and checking her blood glucose, review logs at follow-up visit - sitaGLIPtin-metformin (JANUMET) 50-1000 MG tablet; Take 1 tablet by mouth 2 (two) times daily with a meal.  Dispense: 180 tablet; Refill: 0 - insulin degludec (TRESIBA FLEXTOUCH) 100 UNIT/ML SOPN FlexTouch Pen; Inject 0.4 mLs (40 Units total) into the skin daily at 10 pm.  Dispense: 36 mL; Refill: 0 - gabapentin (NEURONTIN) 300 MG capsule; Take 1 capsule (300 mg total) by mouth at bedtime.  Dispense: 90 capsule; Refill: 0   Barney Russomanno Asad A. Faylene KurtzShah [facility] 03/25/2017 9:14 AM

## 2017-03-26 ENCOUNTER — Other Ambulatory Visit: Payer: Self-pay | Admitting: Family Medicine

## 2017-03-26 DIAGNOSIS — G8929 Other chronic pain: Secondary | ICD-10-CM

## 2017-03-26 DIAGNOSIS — M5442 Lumbago with sciatica, left side: Principal | ICD-10-CM

## 2017-03-26 LAB — VITAMIN D 25 HYDROXY (VIT D DEFICIENCY, FRACTURES): Vit D, 25-Hydroxy: 13 ng/mL — ABNORMAL LOW (ref 30–100)

## 2017-03-26 LAB — HEPATITIS C ANTIBODY: HCV Ab: NEGATIVE

## 2017-03-27 LAB — PAP IG AND HPV HIGH-RISK: HPV DNA HIGH RISK: NOT DETECTED

## 2017-03-30 ENCOUNTER — Telehealth: Payer: Self-pay | Admitting: Family Medicine

## 2017-03-30 NOTE — Telephone Encounter (Signed)
Pt went to [facility] and they do not have the tresiba nor the metformin. Is it possible to call this in? It shows that we sent it in.

## 2017-03-31 ENCOUNTER — Telehealth: Payer: Self-pay | Admitting: Family Medicine

## 2017-03-31 DIAGNOSIS — Z1211 Encounter for screening for malignant neoplasm of colon: Secondary | ICD-10-CM | POA: Insufficient documentation

## 2017-03-31 NOTE — Telephone Encounter (Signed)
Called pharmacy and they are filling both of them, patient was saying Metformin but she is on the combination (Janumet) they have both prescriptions.

## 2017-03-31 NOTE — Telephone Encounter (Signed)
Please DC the Cologuard order, referral to gastroenterology for screening colonoscopy has been placed

## 2017-03-31 NOTE — Telephone Encounter (Signed)
PT SAYS THAT MEDICAID WILL NOT COVER COLOGUARD WILL HAVE TO DO A COLONOSCOPY.

## 2017-04-01 ENCOUNTER — Other Ambulatory Visit: Payer: Self-pay | Admitting: Family Medicine

## 2017-04-01 ENCOUNTER — Other Ambulatory Visit: Payer: Self-pay

## 2017-04-01 DIAGNOSIS — IMO0002 Reserved for concepts with insufficient information to code with codable children: Secondary | ICD-10-CM

## 2017-04-01 DIAGNOSIS — E1140 Type 2 diabetes mellitus with diabetic neuropathy, unspecified: Secondary | ICD-10-CM

## 2017-04-01 DIAGNOSIS — J411 Mucopurulent chronic bronchitis: Secondary | ICD-10-CM

## 2017-04-01 DIAGNOSIS — E1165 Type 2 diabetes mellitus with hyperglycemia: Principal | ICD-10-CM

## 2017-04-01 MED ORDER — INSULIN DETEMIR 100 UNIT/ML FLEXPEN: 34.0000 [IU] | PEN_INJECTOR | Freq: Every day | SUBCUTANEOUS | 5 refills | Status: DC

## 2017-04-01 NOTE — Telephone Encounter (Signed)
PT SAID THAT SHE IS ALSO NEEDING HER INHALER SYMBICORT.

## 2017-04-02 ENCOUNTER — Telehealth: Payer: Self-pay

## 2017-04-02 MED ORDER — VITAMIN D (ERGOCALCIFEROL) 1.25 MG (50000 UNIT) PO CAPS: 50000.0000 [IU] | ORAL_CAPSULE | ORAL | 0 refills | Status: DC

## 2017-04-02 NOTE — Telephone Encounter (Signed)
Patient has been notified of lab results and a prescription for vitamin D3 50,000 units take 1 capsule once a week x12 weeks [facility] per Dr. Sherryll BurgerShah, pt has been notified and verbalized understanding

## 2017-04-02 NOTE — Telephone Encounter (Signed)
Gwendolyn Fuller was able to cal pharm and get this approved and patient was notified ready to pick up at pharm.

## 2017-04-02 NOTE — Telephone Encounter (Signed)
errenous °

## 2017-04-02 NOTE — Telephone Encounter (Signed)
Patient was informed that she has been scheduled to have her mammogram on Thursday, May 21, 2017 @1 :20pm with the DEXA to follow at 1:40pm.  Patient asked if she could have it done in the morning instead and I told her that she could call their office to see if they have something that better suit her needs. Their number was given and she said thanks.

## 2017-04-13 ENCOUNTER — Telehealth: Payer: Self-pay

## 2017-04-13 ENCOUNTER — Other Ambulatory Visit: Payer: Self-pay

## 2017-04-13 DIAGNOSIS — Z1211 Encounter for screening for malignant neoplasm of colon: Secondary | ICD-10-CM

## 2017-04-13 NOTE — Telephone Encounter (Signed)
Gastroenterology Pre-Procedure Review  Request Date: 05/22/17 Requesting Physician: Dr. Servando SnareWohl  PATIENT REVIEW QUESTIONS: The patient responded to the following health history questions as indicated:    1. Are you having any GI issues? no 2. Do you have a personal history of Polyps? no 3. Do you have a family history of Colon Cancer or Polyps? no 4. Diabetes Mellitus? yes (Type 2) 5. Joint replacements in the past 12 months?yes (NO) 6. Major health problems in the past 3 months?no 7. Any artificial heart valves, MVP, or defibrillator?no    MEDICATIONS & ALLERGIES:    Patient reports the following regarding taking any anticoagulation/antiplatelet therapy:   Plavix, Coumadin, Eliquis, Xarelto, Lovenox, Pradaxa, Brilinta, or Effient? no Aspirin? no  Patient confirms/reports the following medications:  Current Outpatient Prescriptions  Medication Sig Dispense Refill  . albuterol (PROAIR HFA) 108 (90 Base) MCG/ACT inhaler INHALE 2 PUFFS BY MOUTH 4 TIMES A DAY AS NEEDED. 8.5 g 2  . atorvastatin (LIPITOR) 40 MG tablet Take 1 tablet (40 mg total) by mouth daily. 90 tablet 3  . budesonide-formoterol (SYMBICORT) 160-4.5 MCG/ACT inhaler Inhale 2 puffs into the lungs 2 (two) times daily. 3 Inhaler 3  . Cholecalciferol (VITAMIN D) 2000 units CAPS Take 1 capsule (2,000 Units total) by mouth daily. 30 capsule   . gabapentin (NEURONTIN) 300 MG capsule Take 1 capsule (300 mg total) by mouth at bedtime. 90 capsule 0  . glucose blood (ACCU-CHEK AVIVA PLUS) test strip Use as instructed to check blood sugars 2 times daily. 180 each 0  . Insulin Detemir (LEVEMIR FLEXTOUCH) 100 UNIT/ML Pen Inject 34 Units into the skin daily at 10 pm. 15 mL 5  . Lancets (ACCU-CHEK SOFT TOUCH) lancets Use as instructed 100 each 12  . Lidocaine HCl 2 % SOLN Use as directed in the mouth or throat.    Marland Kitchen. lisinopril (PRINIVIL,ZESTRIL) 10 MG tablet Take 1 tablet (10 mg total) by mouth daily. 90 tablet 3  . sitaGLIPtin-metformin  (JANUMET) 50-1000 MG tablet Take 1 tablet by mouth 2 (two) times daily with a meal. 180 tablet 0  . tiZANidine (ZANAFLEX) 2 MG tablet TAKE 1 TABLET BY MOUTH THREE TIMES DAILY 90 tablet 2  . UNIFINE PENTIPS 32G X 4 MM MISC USE DAILY 30 each 12  . Vitamin D, Ergocalciferol, (DRISDOL) 50000 units CAPS capsule Take 1 capsule (50,000 Units total) by mouth once a week. For 12 weeks 12 capsule 0   Current Facility-Administered Medications  Medication Dose Route Frequency Provider Last Rate Last Dose  . cefTRIAXone (ROCEPHIN) injection 500 mg  500 mg Intramuscular Once Dennison MascotMorrisey, Lemont, MD      . cefTRIAXone (ROCEPHIN) injection 500 mg  500 mg Intramuscular Once Dennison MascotMorrisey, Lemont, MD      . dextromethorphan (DELSYM) 30 MG/5ML liquid 30 mg  30 mg Oral BID Dennison MascotMorrisey, Lemont, MD      . insulin aspart (novoLOG) injection 10 Units  10 Units Subcutaneous Once Dennison MascotMorrisey, Lemont, MD        Patient confirms/reports the following allergies:  No Known Allergies  No orders of the defined types were placed in this encounter.   AUTHORIZATION INFORMATION Primary Insurance: 1D#: Group #:  Secondary Insurance: 1D#: Group #:  SCHEDULE INFORMATION: Date: 05/22/17 Time: Location:[facility]

## 2017-05-21 ENCOUNTER — Ambulatory Visit
Admission: RE | Admit: 2017-05-21 | Discharge: 2017-05-21 | Disposition: A | Discharge status: Home / Self Care | Payer: Medicaid Other | Source: Ambulatory Visit | Attending: Family Medicine | Admitting: Family Medicine

## 2017-05-21 ENCOUNTER — Encounter: Payer: Self-pay | Admitting: *Deleted

## 2017-05-21 DIAGNOSIS — Z1231 Encounter for screening mammogram for malignant neoplasm of breast: Secondary | ICD-10-CM | POA: Diagnosis present

## 2017-05-21 DIAGNOSIS — M8588 Other specified disorders of bone density and structure, other site: Secondary | ICD-10-CM | POA: Insufficient documentation

## 2017-05-21 DIAGNOSIS — M85851 Other specified disorders of bone density and structure, right thigh: Secondary | ICD-10-CM | POA: Insufficient documentation

## 2017-05-21 DIAGNOSIS — Z1239 Encounter for other screening for malignant neoplasm of breast: Secondary | ICD-10-CM

## 2017-05-21 DIAGNOSIS — Z13820 Encounter for screening for osteoporosis: Secondary | ICD-10-CM | POA: Diagnosis present

## 2017-05-22 ENCOUNTER — Encounter: Payer: Self-pay | Admitting: *Deleted

## 2017-05-22 ENCOUNTER — Encounter
Admission: RE | Disposition: A | Discharge status: Home / Self Care | Payer: Self-pay | Source: Ambulatory Visit | Attending: Gastroenterology

## 2017-05-22 ENCOUNTER — Ambulatory Visit: Payer: Medicaid Other | Admitting: Anesthesiology

## 2017-05-22 ENCOUNTER — Ambulatory Visit
Admission: RE | Admit: 2017-05-22 | Discharge: 2017-05-22 | Disposition: A | Discharge status: Home / Self Care | Payer: Medicaid Other | Source: Ambulatory Visit | Attending: Gastroenterology | Admitting: Gastroenterology

## 2017-05-22 DIAGNOSIS — K621 Rectal polyp: Secondary | ICD-10-CM | POA: Diagnosis not present

## 2017-05-22 DIAGNOSIS — E119 Type 2 diabetes mellitus without complications: Secondary | ICD-10-CM | POA: Diagnosis not present

## 2017-05-22 DIAGNOSIS — Z800 Family history of malignant neoplasm of digestive organs: Secondary | ICD-10-CM | POA: Diagnosis not present

## 2017-05-22 DIAGNOSIS — F17210 Nicotine dependence, cigarettes, uncomplicated: Secondary | ICD-10-CM | POA: Insufficient documentation

## 2017-05-22 DIAGNOSIS — J449 Chronic obstructive pulmonary disease, unspecified: Secondary | ICD-10-CM | POA: Diagnosis not present

## 2017-05-22 DIAGNOSIS — Z1211 Encounter for screening for malignant neoplasm of colon: Secondary | ICD-10-CM | POA: Diagnosis present

## 2017-05-22 DIAGNOSIS — I10 Essential (primary) hypertension: Secondary | ICD-10-CM | POA: Insufficient documentation

## 2017-05-22 DIAGNOSIS — Z79899 Other long term (current) drug therapy: Secondary | ICD-10-CM | POA: Insufficient documentation

## 2017-05-22 DIAGNOSIS — Z794 Long term (current) use of insulin: Secondary | ICD-10-CM | POA: Diagnosis not present

## 2017-05-22 DIAGNOSIS — K640 First degree hemorrhoids: Secondary | ICD-10-CM | POA: Diagnosis not present

## 2017-05-22 HISTORY — PX: COLONOSCOPY WITH PROPOFOL: SHX5780

## 2017-05-22 HISTORY — DX: Dyspnea, unspecified: R06.00

## 2017-05-22 LAB — GLUCOSE, CAPILLARY: GLUCOSE-CAPILLARY: 163 mg/dL — AB (ref 65–99)

## 2017-05-22 SURGERY — COLONOSCOPY WITH PROPOFOL
Anesthesia: General

## 2017-05-22 MED ORDER — SODIUM CHLORIDE 0.9 % IV SOLN
INTRAVENOUS | Status: DC
  Administered 2017-05-22: 1000 mL via INTRAVENOUS
  Administered 2017-05-22: 08:00:00 via INTRAVENOUS

## 2017-05-22 MED ORDER — PROPOFOL 500 MG/50ML IV EMUL
INTRAVENOUS | Status: AC
  Filled 2017-05-22: qty 50

## 2017-05-22 MED ORDER — PROPOFOL 10 MG/ML IV BOLUS
INTRAVENOUS | Status: DC | PRN
  Administered 2017-05-22: 60 mg via INTRAVENOUS
  Administered 2017-05-22: 30 mg via INTRAVENOUS

## 2017-05-22 MED ORDER — PROPOFOL 500 MG/50ML IV EMUL
INTRAVENOUS | Status: DC | PRN
  Administered 2017-05-22: 150 ug/kg/min via INTRAVENOUS

## 2017-05-22 NOTE — Anesthesia Preprocedure Evaluation (Signed)
Anesthesia Evaluation  Patient identified by MRN, date of birth, ID band Patient awake    Reviewed: Allergy & Precautions, H&P , NPO status , Patient's Chart, lab work & pertinent test results, reviewed documented beta blocker date and time   Airway Mallampati: II   Neck ROM: full    Dental  (+) Teeth Intact   Pulmonary neg pulmonary ROS, shortness of breath, COPD, Current Smoker,    Pulmonary exam normal        Cardiovascular hypertension, negative cardio ROS Normal cardiovascular exam Rhythm:regular Rate:Normal     Neuro/Psych  Neuromuscular disease negative neurological ROS  negative psych ROS   GI/Hepatic negative GI ROS, Neg liver ROS,   Endo/Other  negative endocrine ROSdiabetes  Renal/GU negative Renal ROS  negative genitourinary   Musculoskeletal   Abdominal   Peds  Hematology negative hematology ROS (+)   Anesthesia Other Findings Past Medical History: No date: Arthritis No date: Diabetes mellitus without complication (HCC) No date: Dyspnea Past Surgical History: No date: CARPAL TUNNEL RELEASE; Bilateral BMI    Body Mass Index:  27.59 kg/m     Reproductive/Obstetrics negative OB ROS                             Anesthesia Physical Anesthesia Plan  ASA: III  Anesthesia Plan: General   Post-op Pain Management:    Induction:   PONV Risk Score and Plan: 2 and Ondansetron and Dexamethasone  Airway Management Planned:   Additional Equipment:   Intra-op Plan:   Post-operative Plan:   Informed Consent: I have reviewed the patients History and Physical, chart, labs and discussed the procedure including the risks, benefits and alternatives for the proposed anesthesia with the patient or authorized representative who has indicated his/her understanding and acceptance.   Dental Advisory Given  Plan Discussed with: CRNA  Anesthesia Plan Comments:          Anesthesia Quick Evaluation

## 2017-05-22 NOTE — Op Note (Signed)
[facility] Gastroenterology Patient Name: Gwendolyn StackMaritza Stargell Procedure Date: 05/22/2017 7:20 AM MRN: [id number] Account #: [id number] Date of Birth: 1961-08-22 Admit Type: Outpatient Age: 56 [facility] ROOM 4 Gender: Female Note Status: Finalized Procedure:            Colonoscopy Indications:          Screening in patient at increased risk: Family history                        of 1st-degree relative with colorectal cancer Providers:            Wyline MoodKiran Itza Maniaci MD, MD Referring MD:         Charisse MarchMeredith K. Soles (Referring MD) Medicines:            Monitored Anesthesia Care Complications:        No immediate complications. Procedure:            Pre-Anesthesia Assessment:                       - Prior to the procedure, a History and Physical was                        performed, and patient medications, allergies and                        sensitivities were reviewed. The patient's tolerance of                        previous anesthesia was reviewed.                       - The risks and benefits of the procedure and the                        sedation options and risks were discussed with the                        patient. All questions were answered and informed                        consent was obtained.                       - ASA Grade Assessment: III - A patient with severe                        systemic disease.                       After obtaining informed consent, the colonoscope was                        passed under direct vision. Throughout the procedure,                        the patient's blood pressure, pulse, and oxygen                        saturations were monitored continuously. The  Colonoscope was introduced through the anus and                        advanced to the the cecum, identified by the                        appendiceal orifice, IC valve and transillumination.                        The colonoscopy was performed  with ease. The patient                        tolerated the procedure well. The quality of the bowel                        preparation was good. Findings:      The perianal and digital rectal examinations were normal.      Non-bleeding internal hemorrhoids were found during retroflexion. The       hemorrhoids were medium-sized and Grade I (internal hemorrhoids that do       not prolapse).      A 5 mm polyp was found in the rectum. The polyp was sessile. The polyp       was removed with a cold biopsy forceps. Resection and retrieval were       complete.      The exam was otherwise without abnormality on direct and retroflexion       views. Impression:           - Non-bleeding internal hemorrhoids.                       - One 5 mm polyp in the rectum, removed with a cold                        biopsy forceps. Resected and retrieved.                       - The examination was otherwise normal on direct and                        retroflexion views. Recommendation:       - Discharge patient to home (with escort).                       - Resume previous diet.                       - Continue present medications.                       - Await pathology results.                       - Repeat colonoscopy in 5 years for surveillance. Procedure Code(s):    --- Professional ---                       [phone], Colonoscopy, flexible; with biopsy, single or                        multiple Diagnosis Code(s):    --- Professional ---  Z80.0, Family history of malignant neoplasm of                        digestive organs                       K62.1, Rectal polyp                       K64.0, First degree hemorrhoids CPT copyright 2016 American Medical Association. All rights reserved. The codes documented in this report are preliminary and upon coder review may  be revised to meet current compliance requirements. Wyline Mood, MD Wyline Mood MD, MD 05/22/2017 8:27:37 AM This report  has been signed electronically. Number of Addenda: 0 Note Initiated On: 05/22/2017 7:20 AM Scope Withdrawal Time: 0 hours 15 minutes 55 seconds  Total Procedure Duration: 0 hours 20 minutes 46 seconds       [facility]

## 2017-05-22 NOTE — H&P (Signed)
Gwendolyn MoodKiran Kandie Keiper MD [address] [place], [place] [address] Phone: [phone] Fax : [phone]  Primary Care Physician:  Ellyn HackShah, Syed Asad A, MD Primary Gastroenterologist:  Dr. Wyline MoodKiran Adyline Fuller   Pre-Procedure History & Physical: HPI:  Gwendolyn Fuller is a 56 y.o. female is here for an colonoscopy.   Past Medical History:  Diagnosis Date  . Arthritis   . Diabetes mellitus without complication (HCC)   . Dyspnea     Past Surgical History:  Procedure Laterality Date  . CARPAL TUNNEL RELEASE Bilateral     Prior to Admission medications   Medication Sig Start Date End Date Taking? Authorizing Provider  sitaGLIPtin-metformin (JANUMET) 50-1000 MG tablet Take 1 tablet by mouth 2 (two) times daily with a meal. 03/25/17  Yes Velta AddisonShah, Syed Asad A, MD  albuterol (PROAIR HFA) 108 (90 Base) MCG/ACT inhaler INHALE 2 PUFFS BY MOUTH 4 TIMES A DAY AS NEEDED. 02/09/17   Ellyn HackShah, Syed Asad A, MD  atorvastatin (LIPITOR) 40 MG tablet Take 1 tablet (40 mg total) by mouth daily. 07/22/16   Ellyn HackShah, Syed Asad A, MD  budesonide-formoterol [facility](SYMBICORT) 160-4.5 MCG/ACT inhaler Inhale 2 puffs into the lungs 2 (two) times daily. 02/09/17   Ellyn HackShah, Syed Asad A, MD  Cholecalciferol (VITAMIN D) 2000 units CAPS Take 1 capsule (2,000 Units total) by mouth daily. 04/22/16   [name], Chrissie NoaWilliam, MD  gabapentin (NEURONTIN) 300 MG capsule Take 1 capsule (300 mg total) by mouth at bedtime. 03/25/17   Velta AddisonShah, Syed Asad A, MD  glucose blood (ACCU-CHEK AVIVA PLUS) test strip Use as instructed to check blood sugars 2 times daily. 03/02/17   Ellyn HackShah, Syed Asad A, MD  Insulin Detemir (LEVEMIR FLEXTOUCH) 100 UNIT/ML Pen Inject 34 Units into the skin daily at 10 pm. 04/01/17   Ellyn HackShah, Syed Asad A, MD  Lancets (ACCU-CHEK SOFT [facility]) lancets Use as instructed 11/29/15   Dennison MascotMorrisey, Lemont, MD  Lidocaine HCl 2 % SOLN Use as directed in the mouth or throat.    [provider]  lisinopril (PRINIVIL,ZESTRIL) 10 MG tablet Take 1 tablet (10 mg total) by mouth  daily. 07/22/16   Ellyn HackShah, Syed Asad A, MD  tiZANidine (ZANAFLEX) 2 MG tablet TAKE 1 TABLET BY MOUTH THREE TIMES DAILY 03/26/17   Ellyn HackShah, Syed Asad A, MD  UNIFINE PENTIPS 32G X 4 MM MISC USE DAILY 01/22/17   Ellyn HackShah, Syed Asad A, MD  Vitamin D, Ergocalciferol, (DRISDOL) 50000 units CAPS capsule Take 1 capsule (50,000 Units total) by mouth once a week. For 12 weeks 04/02/17   Ellyn HackShah, Syed Asad A, MD    Allergies as of 04/13/2017  . (No Known Allergies)    History reviewed. No pertinent family history.  Social History   Social History  . Marital status: Married    Spouse name: N/A  . Number of children: N/A  . Years of education: N/A   Occupational History  . Not on file.   Social History Main Topics  . Smoking status: Current Every Day Smoker    Packs/day: 1.00    Types: Cigarettes  . Smokeless tobacco: Never Used  . Alcohol use No  . Drug use: No  . Sexual activity: Yes   Other Topics Concern  . Not on file   Social History Narrative  . No narrative on file    Review of Systems: See HPI, otherwise negative ROS  Physical Exam: BP 112/73   Pulse 91   Temp (!) 97.3 F (36.3 C) (Tympanic)   Resp 20  Ht 5\' 1"  (1.549 m)   Wt 146 lb (66.2 kg)   SpO2 100%   BMI 27.59 kg/m  General:   Alert,  pleasant and cooperative in NAD Head:  Normocephalic and atraumatic. Neck:  Supple; no masses or thyromegaly. Lungs:  Clear throughout to auscultation.    Heart:  Regular rate and rhythm. Abdomen:  Soft, nontender and nondistended. Normal bowel sounds, without guarding, and without rebound.   Neurologic:  Alert and  oriented x4;  grossly normal neurologically.  Impression/Plan: Gwendolyn Fuller is here for an colonoscopy to be performed for colon cancer screening , mother had colon cancer   Risks, benefits, limitations, and alternatives regarding  colonoscopy have been reviewed with the patient.  Questions have been answered.  All parties agreeable.   Gwendolyn MoodKiran English Tomer, MD  05/22/2017, 7:55  AM

## 2017-05-22 NOTE — Transfer of Care (Signed)
Immediate Anesthesia Transfer of Care Note  Patient: Gwendolyn StackMaritza Fuller  Procedure(s) Performed: Procedure(s): COLONOSCOPY WITH PROPOFOL (N/A)  Patient Location: PACU  Anesthesia Type:General  Level of Consciousness: sedated  Airway & Oxygen Therapy: Patient Spontanous Breathing and Patient connected to nasal cannula oxygen  Post-op Assessment: Report given to RN and Post -op Vital signs reviewed and stable  Post vital signs: Reviewed and stable  Last Vitals:  Vitals:   05/22/17 0727  BP: 112/73  Pulse: 91  Resp: 20  Temp: (!) 36.3 C    Last Pain:  Vitals:   05/22/17 0727  TempSrc: Tympanic         Complications: No apparent anesthesia complications

## 2017-05-22 NOTE — Anesthesia Post-op Follow-up Note (Cosign Needed)
Anesthesia QCDR form completed.        

## 2017-05-22 NOTE — Anesthesia Procedure Notes (Signed)
Date/Time: 05/22/2017 8:09 AM Performed by: Junious SilkNOLES, Jaella Weinert Pre-anesthesia Checklist: Patient identified, Emergency Drugs available, Suction available, Patient being monitored and Timeout performed Oxygen Delivery Method: Nasal cannula

## 2017-05-25 ENCOUNTER — Ambulatory Visit: Payer: Medicaid Other | Admitting: Family Medicine

## 2017-05-25 ENCOUNTER — Encounter: Payer: Self-pay | Admitting: Gastroenterology

## 2017-05-25 LAB — SURGICAL PATHOLOGY

## 2017-05-26 NOTE — Anesthesia Postprocedure Evaluation (Signed)
Anesthesia Post Note  Patient: Gwendolyn StackMaritza Fuller  Procedure(s) Performed: Procedure(s) (LRB): COLONOSCOPY WITH PROPOFOL (N/A)  Patient location during evaluation: PACU Anesthesia Type: General Level of consciousness: awake and alert Pain management: pain level controlled Vital Signs Assessment: post-procedure vital signs reviewed and stable Respiratory status: spontaneous breathing, nonlabored ventilation, respiratory function stable and patient connected to nasal cannula oxygen Cardiovascular status: blood pressure returned to baseline and stable Postop Assessment: no signs of nausea or vomiting Anesthetic complications: no     Last Vitals:  Vitals:   05/22/17 0850 05/22/17 0900  BP: (!) 112/95 117/62  Pulse: 82 78  Resp: 19 18  Temp:      Last Pain:  Vitals:   05/22/17 0830  TempSrc: Tympanic  PainSc: Asleep                 Yevette EdwardsJames G Adams

## 2017-05-28 ENCOUNTER — Encounter: Payer: Self-pay | Admitting: Gastroenterology

## 2017-06-08 ENCOUNTER — Other Ambulatory Visit: Payer: Self-pay | Admitting: Family Medicine

## 2017-06-08 NOTE — Telephone Encounter (Signed)
Please clarify type of insulin and units with patient, then forward to Dr. Sherryll BurgerShah.

## 2017-06-09 ENCOUNTER — Ambulatory Visit: Payer: Medicaid Other | Admitting: Family Medicine

## 2017-06-12 ENCOUNTER — Telehealth: Payer: Self-pay | Admitting: Family Medicine

## 2017-06-12 NOTE — Telephone Encounter (Signed)
Patient is not currently taking [place]. It was discontinued back in May 2018 b/c it is not covered by her insurance.

## 2017-06-12 NOTE — Telephone Encounter (Signed)
Review of patient's chart shows that she is already on Levemir flex touch, started in June 2018. Please confirm

## 2017-06-12 NOTE — Telephone Encounter (Signed)
PREFERRED Lantus Solostar/ Vial Levemir Flextouch/ FlexPen/ Vial  Non-Preferred Basaglar [name] Tresiba Flextouch Toujeo Solostar   Gwendolyn Fuller has scheduled her for 06/26/17.  We do not have any samples of Lantus or Evaristo Bury so a rx will need to be written.

## 2017-06-12 NOTE — Telephone Encounter (Signed)
Pt said that her Gwendolyn Fuller is going to need a prior approval.

## 2017-06-12 NOTE — Telephone Encounter (Signed)
Please find out what other basal insulin is covered by insurance so we can start her on the appropriate treatment. In addition, please schedule patient for an appointment in 2 weeks to reassess her diabetic control

## 2017-06-15 NOTE — Telephone Encounter (Signed)
Patient has tried Building services engineer and it did not work so she was switched to Gwendolyn Fuller which has been working just fine.  A prior Berkley Harvey will be done with [place] [facility] so she could get it refilled.

## 2017-06-15 NOTE — Telephone Encounter (Signed)
Gwendolyn Fuller has been approved for 1 year

## 2017-06-26 ENCOUNTER — Telehealth: Payer: Self-pay | Admitting: Family Medicine

## 2017-06-26 ENCOUNTER — Ambulatory Visit: Payer: Medicaid Other | Admitting: Family Medicine

## 2017-06-26 NOTE — Telephone Encounter (Signed)
Pt had appt for today but had to reschedule due to running late for appt. She did resch for this coming Wednesday (07/01/17) but will be completely out of tresebia before her appt. Please send one refill to [facility]. [phone]

## 2017-07-01 ENCOUNTER — Ambulatory Visit (INDEPENDENT_AMBULATORY_CARE_PROVIDER_SITE_OTHER): Payer: Medicaid Other | Admitting: Family Medicine

## 2017-07-01 ENCOUNTER — Encounter: Payer: Self-pay | Admitting: Family Medicine

## 2017-07-01 VITALS — BP 115/74 | HR 87 | Temp 97.4°F | Resp 16 | Ht 61.0 in | Wt 147.1 lb

## 2017-07-01 DIAGNOSIS — E1140 Type 2 diabetes mellitus with diabetic neuropathy, unspecified: Secondary | ICD-10-CM

## 2017-07-01 DIAGNOSIS — E782 Mixed hyperlipidemia: Secondary | ICD-10-CM

## 2017-07-01 DIAGNOSIS — Z23 Encounter for immunization: Secondary | ICD-10-CM | POA: Diagnosis not present

## 2017-07-01 DIAGNOSIS — E1165 Type 2 diabetes mellitus with hyperglycemia: Secondary | ICD-10-CM | POA: Diagnosis not present

## 2017-07-01 DIAGNOSIS — I10 Essential (primary) hypertension: Secondary | ICD-10-CM | POA: Diagnosis not present

## 2017-07-01 DIAGNOSIS — IMO0002 Reserved for concepts with insufficient information to code with codable children: Secondary | ICD-10-CM

## 2017-07-01 LAB — POCT GLYCOSYLATED HEMOGLOBIN (HGB A1C): Hemoglobin A1C: 8.3

## 2017-07-01 LAB — GLUCOSE, POCT (MANUAL RESULT ENTRY): POC GLUCOSE: 195 mg/dL — AB (ref 70–99)

## 2017-07-01 MED ORDER — ACCU-CHEK SOFT TOUCH LANCETS MISC: 12 refills | Status: DC

## 2017-07-01 MED ORDER — INSULIN DEGLUDEC 100 UNIT/ML ~~LOC~~ SOPN: 40.0000 [IU] | PEN_INJECTOR | Freq: Every day | SUBCUTANEOUS | 3 refills | Status: DC

## 2017-07-01 MED ORDER — GABAPENTIN 300 MG PO CAPS: 300.0000 mg | ORAL_CAPSULE | Freq: Two times a day (BID) | ORAL | 0 refills | Status: DC

## 2017-07-01 MED ORDER — UNIFINE PENTIPS 32G X 4 MM MISC: 2 refills | Status: DC

## 2017-07-01 MED ORDER — GLUCOSE BLOOD VI STRP: ORAL_STRIP | 2 refills | Status: DC

## 2017-07-01 MED ORDER — LISINOPRIL 10 MG PO TABS: 10.0000 mg | ORAL_TABLET | Freq: Every day | ORAL | 0 refills | Status: DC

## 2017-07-01 MED ORDER — ATORVASTATIN CALCIUM 40 MG PO TABS: 40.0000 mg | ORAL_TABLET | Freq: Every day | ORAL | 0 refills | Status: DC

## 2017-07-01 NOTE — Progress Notes (Signed)
Name: Gwendolyn Fuller   MRN: [id number]    DOB: Jun 09, 1961   Date:07/01/2017       Progress Note  Subjective  Chief Complaint  Chief Complaint  Patient presents with  . Medication Refill    Diabetes  She presents for her follow-up diabetic visit. She has type 2 diabetes mellitus. Her disease course has been improving. There are no hypoglycemic associated symptoms. Pertinent negatives for hypoglycemia include no headaches. Associated symptoms include fatigue, foot paresthesias, polydipsia and polyuria. Pertinent negatives for diabetes include no blurred vision and no chest pain. Symptoms are worsening. Diabetic complications include peripheral neuropathy. Pertinent negatives for diabetic complications include no CVA or heart disease. Current diabetic treatment includes intensive insulin program and oral agent (dual therapy). Her weight is stable. She is following a diabetic diet. She monitors blood glucose at home 3-4 x per day. Her breakfast blood glucose range is generally >200 mg/dl. An ACE inhibitor/angiotensin II receptor blocker is being taken. Eye exam is not current.  Hyperlipidemia  This is a chronic problem. The problem is uncontrolled. Recent lipid tests were reviewed and are high (elevated total cholesterol and Triglycerides). Pertinent negatives include no chest pain, leg pain, myalgias or shortness of breath. Current antihyperlipidemic treatment includes statins.  Hypertension  This is a chronic problem. The problem is unchanged. The problem is controlled. Pertinent negatives include no blurred vision, chest pain, headaches, palpitations or shortness of breath. Past treatments include ACE inhibitors. There is no history of kidney disease, CAD/MI or CVA.     Past Medical History:  Diagnosis Date  . Arthritis   . Diabetes mellitus without complication (HCC)   . Dyspnea     Past Surgical History:  Procedure Laterality Date  . CARPAL TUNNEL RELEASE Bilateral   . COLONOSCOPY WITH  PROPOFOL N/A 05/22/2017   Procedure: COLONOSCOPY WITH PROPOFOL;  Surgeon: Wyline Mood, MD;  Location: [facility] ENDOSCOPY;  Service: Endoscopy;  Laterality: N/A;    History reviewed. No pertinent family history.  Social History   Social History  . Marital status: Married    Spouse name: N/A  . Number of children: N/A  . Years of education: N/A   Occupational History  . Not on file.   Social History Main Topics  . Smoking status: Current Every Day Smoker    Packs/day: 1.00    Types: Cigarettes  . Smokeless tobacco: Never Used  . Alcohol use No  . Drug use: No  . Sexual activity: Yes   Other Topics Concern  . Not on file   Social History Narrative  . No narrative on file     Current Outpatient Prescriptions:  .  albuterol (PROAIR HFA) 108 (90 Base) MCG/ACT inhaler, INHALE 2 PUFFS BY MOUTH 4 TIMES A DAY AS NEEDED., Disp: 8.5 g, Rfl: 2 .  atorvastatin (LIPITOR) 40 MG tablet, Take 1 tablet (40 mg total) by mouth daily., Disp: 90 tablet, Rfl: 3 .  budesonide-formoterol (SYMBICORT) 160-4.5 MCG/ACT inhaler, Inhale 2 puffs into the lungs 2 (two) times daily., Disp: 3 Inhaler, Rfl: 3 .  gabapentin (NEURONTIN) 300 MG capsule, Take 1 capsule (300 mg total) by mouth at bedtime., Disp: 90 capsule, Rfl: 0 .  glucose blood (ACCU-CHEK AVIVA PLUS) test strip, Use as instructed to check blood sugars 2 times daily., Disp: 180 each, Rfl: 0 .  Lancets (ACCU-CHEK SOFT TOUCH) lancets, Use as instructed, Disp: 100 each, Rfl: 12 .  Lidocaine HCl 2 % SOLN, Use as directed in the mouth or throat., Disp: ,  Rfl:  .  lisinopril (PRINIVIL,ZESTRIL) 10 MG tablet, Take 1 tablet (10 mg total) by mouth daily., Disp: 90 tablet, Rfl: 3 .  sitaGLIPtin-metformin (JANUMET) 50-1000 MG tablet, Take 1 tablet by mouth 2 (two) times daily with a meal., Disp: 180 tablet, Rfl: 0 .  tiZANidine (ZANAFLEX) 2 MG tablet, TAKE 1 TABLET BY MOUTH THREE TIMES DAILY, Disp: 90 tablet, Rfl: 2 .  UNIFINE PENTIPS 32G X 4 MM MISC, USE  DAILY, Disp: 30 each, Rfl: 12  Current Facility-Administered Medications:  .  cefTRIAXone (ROCEPHIN) injection 500 mg, 500 mg, Intramuscular, Once, Morrisey, Lemont, MD .  cefTRIAXone (ROCEPHIN) injection 500 mg, 500 mg, Intramuscular, Once, Dennison Mascot, MD .  dextromethorphan (DELSYM) 30 MG/5ML liquid 30 mg, 30 mg, Oral, BID, Morrisey, Lemont, MD .  insulin aspart (novoLOG) injection 10 Units, 10 Units, Subcutaneous, Once, Dennison Mascot, MD  No Known Allergies   Review of Systems  Constitutional: Positive for fatigue.  Eyes: Negative for blurred vision.  Respiratory: Negative for shortness of breath.   Cardiovascular: Negative for chest pain and palpitations.  Musculoskeletal: Negative for myalgias.  Neurological: Negative for headaches.  Endo/Heme/Allergies: Positive for polydipsia.     Objective  Vitals:   07/01/17 1147  BP: 115/74  Pulse: 87  Resp: 16  Temp: (!) 97.4 F (36.3 C)  TempSrc: Oral  SpO2: 97%  Weight: 147 lb 1.6 oz (66.7 kg)  Height: 5\' 1"  (1.549 m)    Physical Exam  Constitutional: [name], place, and time and well-developed, well-nourished, and in no distress.  HENT:  Head: Normocephalic and atraumatic.  Cardiovascular: Normal rate, regular rhythm and normal heart sounds.   No murmur heard. Pulmonary/Chest: Effort normal and breath sounds normal. She has no wheezes.  Abdominal: Soft. Bowel sounds are normal. There is no tenderness.  Musculoskeletal: She exhibits no edema.  Neurological: She is alert and oriented to person, place, and time.  Psychiatric: Mood, memory, affect and judgment normal.  Nursing note and vitals reviewed.     Assessment & Plan 1. Uncontrolled type 2 diabetes with neuropathy (HCC) A1c is improved, advised to continue on Tresiba, increased to 40 units at bedtime by going up 2 units every 3-4 days to continue on Janumet, continue to check blood glucose twice daily and will refer to ophthalmology  for diabetic eye exam - POCT HgB A1C - POCT Glucose (CBG) - insulin degludec (TRESIBA) 100 UNIT/ML SOPN FlexTouch Pen; Inject 0.4 mLs (40 Units total) into the skin daily at 10 pm.  Dispense: 10 pen; Refill: 3 - gabapentin (NEURONTIN) 300 MG capsule; Take 1 capsule (300 mg total) by mouth 2 (two) times daily.  Dispense: 180 capsule; Refill: 0 - Ambulatory referral to Ophthalmology - glucose blood (ACCU-CHEK AVIVA PLUS) test strip; Use as instructed to check blood sugars 2 times daily.  Dispense: 180 each; Refill: 2 - Lancets (ACCU-CHEK SOFT TOUCH) lancets; Use as directed to check blood glucose twice daily  Dispense: 100 each; Refill: 12 - UNIFINE PENTIPS 32G X 4 MM MISC; Use as directed with [place].  Dispense: 200 each; Refill: 2  2. Essential hypertension  - lisinopril (PRINIVIL,ZESTRIL) 10 MG tablet; Take 1 tablet (10 mg total) by mouth daily.  Dispense: 90 tablet; Refill: 0  3. Hypercholesterolemia with hypertriglyceridemia  - Lipid panel - COMPLETE METABOLIC PANEL WITH GFR - atorvastatin (LIPITOR) 40 MG tablet; Take 1 tablet (40 mg total) by mouth daily.  Dispense: 90 tablet; Refill: 0  4. Need for influenza vaccination  -  Flu Vaccine QUAD 6+ mos PF IM (Fluarix Quad PF)   Amethyst Gainer Asad A. Faylene KurtzShah [facility] 07/01/2017 12:05 PM

## 2017-07-02 LAB — LIPID PANEL
CHOL/HDL RATIO: 4.7 (calc) (ref <5.0)
CHOLESTEROL: 191 mg/dL (ref <200)
HDL: 41 mg/dL — AB (ref >50)
LDL CHOLESTEROL (CALC): 117 mg/dL — AB
Non-HDL Cholesterol (Calc): 150 mg/dL (calc) — ABNORMAL HIGH (ref <130)
TRIGLYCERIDES: 213 mg/dL — AB (ref <150)

## 2017-07-02 LAB — COMPLETE METABOLIC PANEL WITH GFR
AG RATIO: 1.4 (calc) (ref 1.0–2.5)
ALKALINE PHOSPHATASE (APISO): 102 U/L (ref 33–130)
ALT: 18 U/L (ref 6–29)
AST: 13 U/L (ref 10–35)
Albumin: 4.1 g/dL (ref 3.6–5.1)
BUN: 9 mg/dL (ref 7–25)
CO2: 25 mmol/L (ref 20–32)
Calcium: 9.3 mg/dL (ref 8.6–10.4)
Chloride: 104 mmol/L (ref 98–110)
Creat: 0.56 mg/dL (ref 0.50–1.05)
GFR, Est African American: 119 mL/min/{1.73_m2} (ref >=60)
GFR, Est Non African American: 103 mL/min/{1.73_m2} (ref >=60)
GLOBULIN: 3 g/dL (ref 1.9–3.7)
Glucose, Bld: 164 mg/dL — ABNORMAL HIGH (ref 65–99)
POTASSIUM: 4.5 mmol/L (ref 3.5–5.3)
SODIUM: 140 mmol/L (ref 135–146)
Total Bilirubin: 0.3 mg/dL (ref 0.2–1.2)
Total Protein: 7.1 g/dL (ref 6.1–8.1)

## 2017-08-05 ENCOUNTER — Other Ambulatory Visit: Payer: Self-pay | Admitting: Family Medicine

## 2017-08-05 DIAGNOSIS — E1165 Type 2 diabetes mellitus with hyperglycemia: Principal | ICD-10-CM

## 2017-08-05 DIAGNOSIS — E1140 Type 2 diabetes mellitus with diabetic neuropathy, unspecified: Secondary | ICD-10-CM

## 2017-08-05 DIAGNOSIS — IMO0002 Reserved for concepts with insufficient information to code with codable children: Secondary | ICD-10-CM

## 2017-08-07 ENCOUNTER — Other Ambulatory Visit: Payer: Self-pay | Admitting: Family Medicine

## 2017-08-07 DIAGNOSIS — E1165 Type 2 diabetes mellitus with hyperglycemia: Principal | ICD-10-CM

## 2017-08-07 DIAGNOSIS — E1140 Type 2 diabetes mellitus with diabetic neuropathy, unspecified: Secondary | ICD-10-CM

## 2017-08-07 DIAGNOSIS — IMO0002 Reserved for concepts with insufficient information to code with codable children: Secondary | ICD-10-CM

## 2017-09-11 ENCOUNTER — Other Ambulatory Visit: Payer: Self-pay | Admitting: Family Medicine

## 2017-09-11 DIAGNOSIS — IMO0002 Reserved for concepts with insufficient information to code with codable children: Secondary | ICD-10-CM

## 2017-09-11 DIAGNOSIS — E1140 Type 2 diabetes mellitus with diabetic neuropathy, unspecified: Secondary | ICD-10-CM

## 2017-09-11 DIAGNOSIS — E1165 Type 2 diabetes mellitus with hyperglycemia: Principal | ICD-10-CM

## 2017-09-30 ENCOUNTER — Ambulatory Visit: Payer: Medicaid Other | Admitting: Family Medicine

## 2017-10-26 ENCOUNTER — Ambulatory Visit: Payer: Medicaid Other | Admitting: Family Medicine

## 2017-10-26 ENCOUNTER — Telehealth: Payer: Self-pay

## 2017-10-26 ENCOUNTER — Encounter: Payer: Self-pay | Admitting: Family Medicine

## 2017-10-26 VITALS — BP 124/76 | HR 100 | Temp 97.9°F | Resp 18 | Ht 61.0 in | Wt 146.9 lb

## 2017-10-26 DIAGNOSIS — M5442 Lumbago with sciatica, left side: Principal | ICD-10-CM

## 2017-10-26 DIAGNOSIS — I10 Essential (primary) hypertension: Secondary | ICD-10-CM | POA: Diagnosis not present

## 2017-10-26 DIAGNOSIS — E1140 Type 2 diabetes mellitus with diabetic neuropathy, unspecified: Secondary | ICD-10-CM | POA: Diagnosis not present

## 2017-10-26 DIAGNOSIS — E1165 Type 2 diabetes mellitus with hyperglycemia: Secondary | ICD-10-CM | POA: Diagnosis not present

## 2017-10-26 DIAGNOSIS — IMO0002 Reserved for concepts with insufficient information to code with codable children: Secondary | ICD-10-CM

## 2017-10-26 DIAGNOSIS — E782 Mixed hyperlipidemia: Secondary | ICD-10-CM | POA: Diagnosis not present

## 2017-10-26 DIAGNOSIS — G8929 Other chronic pain: Secondary | ICD-10-CM

## 2017-10-26 LAB — POCT GLYCOSYLATED HEMOGLOBIN (HGB A1C): Hemoglobin A1C: 7.8

## 2017-10-26 LAB — GLUCOSE, POCT (MANUAL RESULT ENTRY): POC Glucose: 235 mg/dl — AB (ref 70–99)

## 2017-10-26 LAB — LIPID PANEL
CHOL/HDL RATIO: 7 (calc) — AB (ref <5.0)
Cholesterol: 253 mg/dL — ABNORMAL HIGH (ref <200)
HDL: 36 mg/dL — ABNORMAL LOW (ref >50)
NON-HDL CHOLESTEROL (CALC): 217 mg/dL — AB (ref <130)
Triglycerides: 746 mg/dL — ABNORMAL HIGH (ref <150)

## 2017-10-26 MED ORDER — SITAGLIPTIN PHOS-METFORMIN HCL 50-1000 MG PO TABS: 1.0000 | ORAL_TABLET | Freq: Two times a day (BID) | ORAL | 0 refills | Status: DC

## 2017-10-26 MED ORDER — ATORVASTATIN CALCIUM 40 MG PO TABS: 40.0000 mg | ORAL_TABLET | Freq: Every day | ORAL | 0 refills | Status: DC

## 2017-10-26 MED ORDER — GLIMEPIRIDE 4 MG PO TABS: ORAL_TABLET | ORAL | 2 refills | Status: DC

## 2017-10-26 MED ORDER — LISINOPRIL 10 MG PO TABS: 10.0000 mg | ORAL_TABLET | Freq: Every day | ORAL | 0 refills | Status: DC

## 2017-10-26 MED ORDER — INSULIN DEGLUDEC 100 UNIT/ML ~~LOC~~ SOPN: 40.0000 [IU] | PEN_INJECTOR | Freq: Every day | SUBCUTANEOUS | 3 refills | Status: DC

## 2017-10-26 NOTE — Telephone Encounter (Signed)
Dr.shah spoke to the patient and she states she is having some pain on her left side. Pt had Xray schedule last year  In 06/2016 but expired. Dr. Sherryll BurgerShah would like for the patient to get an xray.

## 2017-10-26 NOTE — Progress Notes (Signed)
Name: Gwendolyn Fuller   MRN: [id number]    DOB: 12-Jan-1961   Date:10/26/2017       Progress Note  Subjective  Chief Complaint  Chief Complaint  Patient presents with  . Medication Refill    3 month F/U  . Diabetes    Checks 3 to 4 times daily, Lowest-130 Average-186 Highest-330  . Hypertension    Having frequent headaches  . Hyperlipidemia    Diabetes  She presents for her follow-up diabetic visit. She has type 2 diabetes mellitus. Her disease course has been improving. There are no hypoglycemic associated symptoms. Pertinent negatives for hypoglycemia include no dizziness, headaches, pallor or sweats. Associated symptoms include foot paresthesias (tingling on both toes.). Pertinent negatives for diabetes include no chest pain, no fatigue, no polydipsia and no polyuria. Pertinent negatives for diabetic complications include no CVA, heart disease or peripheral neuropathy. Current diabetic treatment includes intensive insulin program and oral agent (triple therapy). She is following a generally healthy and diabetic (eats a lot of vegetables.) diet. She monitors blood glucose at home 3-4 x per day. Her breakfast blood glucose range is generally 180-200 mg/dl. Her bedtime blood glucose range is generally >200 mg/dl. An ACE inhibitor/angiotensin II receptor blocker is being taken. Eye exam is current.  Hypertension  This is a chronic problem. The problem is unchanged. The problem is controlled. Pertinent negatives include no chest pain, headaches, palpitations, shortness of breath or sweats. Past treatments include ACE inhibitors. There is no history of kidney disease, CAD/MI or CVA.  Hyperlipidemia  This is a chronic problem. The problem is uncontrolled. Recent lipid tests were reviewed and are high. Exacerbating diseases include diabetes. Pertinent negatives include no chest pain, leg pain, myalgias or shortness of breath. Current antihyperlipidemic treatment includes statins. Risk factors for  coronary artery disease include dyslipidemia.     Past Medical History:  Diagnosis Date  . Arthritis   . Diabetes mellitus without complication (HCC)   . Dyspnea     Past Surgical History:  Procedure Laterality Date  . CARPAL TUNNEL RELEASE Bilateral   . COLONOSCOPY WITH PROPOFOL N/A 05/22/2017   Procedure: COLONOSCOPY WITH PROPOFOL;  Surgeon: Wyline MoodAnna, Kiran, MD;  [facility];  Service: Endoscopy;  Laterality: N/A;    No family history on file.  Social History   Socioeconomic History  . Marital status: Married    Spouse name: Not on file  . Number of children: Not on file  . Years of education: Not on file  . Highest education level: Not on file  Social Needs  . Financial resource strain: Not on file  . Food insecurity - worry: Not on file  . Food insecurity - inability: Not on file  . Transportation needs - medical: Not on file  . Transportation needs - non-medical: Not on file  Occupational History  . Not on file  Tobacco Use  . Smoking status: Current Every Day Smoker    Packs/day: 1.00    Types: Cigarettes  . Smokeless tobacco: Never Used  Substance and Sexual Activity  . Alcohol use: No  . Drug use: No  . Sexual activity: Yes  Other Topics Concern  . Not on file  Social History Narrative  . Not on file     Current Outpatient Medications:  .  albuterol (PROAIR HFA) 108 (90 Base) MCG/ACT inhaler, INHALE 2 PUFFS BY MOUTH 4 TIMES A DAY AS NEEDED., Disp: 8.5 g, Rfl: 2 .  atorvastatin (LIPITOR) 40 MG tablet, Take 1 tablet (40  mg total) by mouth daily., Disp: 90 tablet, Rfl: 0 .  budesonide-formoterol (SYMBICORT) 160-4.5 MCG/ACT inhaler, Inhale 2 puffs into the lungs 2 (two) times daily., Disp: 3 Inhaler, Rfl: 3 .  gabapentin (NEURONTIN) 300 MG capsule, Take 1 capsule (300 mg total) by mouth 2 (two) times daily., Disp: 180 capsule, Rfl: 0 .  glimepiride (AMARYL) 4 MG tablet, TAKE TWO TABLETS BY MOUTH DAILY WITH BREAKFAST, Disp: 60 tablet, Rfl: 2 .   glucose blood (ACCU-CHEK AVIVA PLUS) test strip, Use as instructed to check blood sugars 2 times daily., Disp: 180 each, Rfl: 2 .  JANUMET 50-1000 MG tablet, TAKE 1 TABLET BY MOUTH TWICE DAILY WITH MEALS, Disp: 180 tablet, Rfl: 0 .  Lancets (ACCU-CHEK SOFT TOUCH) lancets, Use as directed to check blood glucose twice daily, Disp: 100 each, Rfl: 12 .  Lidocaine HCl 2 % SOLN, Use as directed in the mouth or throat., Disp: , Rfl:  .  lisinopril (PRINIVIL,ZESTRIL) 10 MG tablet, Take 1 tablet (10 mg total) by mouth daily., Disp: 90 tablet, Rfl: 0 .  tiZANidine (ZANAFLEX) 2 MG tablet, TAKE 1 TABLET BY MOUTH THREE TIMES DAILY, Disp: 90 tablet, Rfl: 2 .  UNIFINE PENTIPS 32G X 4 MM MISC, Use as directed with Evaristo Buryresiba., Disp: 200 each, Rfl: 2 .  insulin degludec (TRESIBA) 100 UNIT/ML SOPN FlexTouch Pen, Inject 0.4 mLs (40 Units total) into the skin daily at 10 pm., Disp: 10 pen, Rfl: 3  Current Facility-Administered Medications:  .  cefTRIAXone (ROCEPHIN) injection 500 mg, 500 mg, Intramuscular, Once, Morrisey, Lemont, MD .  cefTRIAXone (ROCEPHIN) injection 500 mg, 500 mg, Intramuscular, Once, Dennison MascotMorrisey, Lemont, MD .  dextromethorphan (DELSYM) 30 MG/5ML liquid 30 mg, 30 mg, Oral, BID, Dennison MascotMorrisey, Lemont, MD  No Known Allergies   Review of Systems  Constitutional: Negative for fatigue.  Respiratory: Negative for shortness of breath.   Cardiovascular: Negative for chest pain and palpitations.  Musculoskeletal: Negative for myalgias.  Skin: Negative for pallor.  Neurological: Negative for dizziness and headaches.  Endo/Heme/Allergies: Negative for polydipsia.      Objective  Vitals:   10/26/17 0952  BP: 124/76  Pulse: 100  Resp: 18  Temp: 97.9 F (36.6 C)  TempSrc: Oral  SpO2: 98%  Weight: 146 lb 14.4 oz (66.6 kg)  Height: 5\' 1"  (1.549 m)    Physical Exam  Constitutional: [name], place, and time and well-developed, well-nourished, and in no distress.  HENT:  Head:  Normocephalic and atraumatic.  Eyes: Pupils are equal, round, and reactive to light.  Neck: Neck supple. No thyromegaly present.  Cardiovascular: Normal rate, regular rhythm and normal heart sounds.  No murmur heard. Pulmonary/Chest: Effort normal and breath sounds normal. She has no wheezes.  Abdominal: Soft. Bowel sounds are normal. There is no tenderness.  Musculoskeletal: She exhibits no edema.  Neurological: She is alert and oriented to person, place, and time.  Skin: Skin is warm and dry.  Psychiatric: Mood, memory, affect and judgment normal.  Nursing note and vitals reviewed.     Recent Results (from the past 2160 hour(s))  POCT HgB A1C     Status: Abnormal   Collection Time: 10/26/17  9:58 AM  Result Value Ref Range   Hemoglobin A1C 7.8   POCT Glucose (CBG)     Status: Abnormal   Collection Time: 10/26/17  9:59 AM  Result Value Ref Range   POC Glucose 235 (A) 70 - 99 mg/dl     Assessment & Plan  1. Uncontrolled type 2 diabetes with neuropathy (HCC) 1 A1c 7.8%, improving on insulin and oral diabetes medications, advised on continued dietary and lifestyle changes - POCT HgB A1C - POCT Glucose (CBG) - glimepiride (AMARYL) 4 MG tablet; TAKE TWO TABLETS BY MOUTH DAILY WITH BREAKFAST  Dispense: 60 tablet; Refill: 2 - insulin degludec (TRESIBA) 100 UNIT/ML SOPN FlexTouch Pen; Inject 0.4 mLs (40 Units total) into the skin daily at 10 pm.  Dispense: 10 pen; Refill: 3 - sitaGLIPtin-metformin (JANUMET) 50-1000 MG tablet; Take 1 tablet by mouth 2 (two) times daily with a meal.  Dispense: 180 tablet; Refill: 0  2. Essential hypertension BP stable on present antihypertensive treatment - lisinopril (PRINIVIL,ZESTRIL) 10 MG tablet; Take 1 tablet (10 mg total) by mouth daily.  Dispense: 90 tablet; Refill: 0  3. Mixed hyperlipidemia FLP not at goal from 3 months ago, repeat today and consider adjusting statin - Lipid panel - atorvastatin (LIPITOR) 40 MG tablet; Take 1 tablet (40  mg total) by mouth daily.  Dispense: 90 tablet; Refill: 0   Keldan Eplin Asad A. [facility] 10/26/2017 10:05 AM

## 2017-10-30 ENCOUNTER — Other Ambulatory Visit: Payer: Self-pay | Admitting: Family Medicine

## 2017-10-30 ENCOUNTER — Other Ambulatory Visit: Payer: Self-pay

## 2017-10-30 DIAGNOSIS — J411 Mucopurulent chronic bronchitis: Secondary | ICD-10-CM

## 2017-10-30 MED ORDER — ROSUVASTATIN CALCIUM 40 MG PO TABS: 20.0000 mg | ORAL_TABLET | Freq: Every day | ORAL | 0 refills | Status: DC

## 2017-10-30 NOTE — Progress Notes (Signed)
Statin therapy has changed. Dr. Sherryll BurgerShah approval take pt off Lipitor 40 mg nightly  and be put on Crestor 40 mg tab qd at bed. Pt has been notified

## 2017-11-03 ENCOUNTER — Ambulatory Visit: Payer: Medicaid Other | Admitting: Family Medicine

## 2017-11-03 ENCOUNTER — Ambulatory Visit
Admission: RE | Admit: 2017-11-03 | Discharge: 2017-11-03 | Disposition: A | Discharge status: Home / Self Care | Payer: Medicaid Other | Source: Ambulatory Visit | Attending: Family Medicine | Admitting: Family Medicine

## 2017-11-03 ENCOUNTER — Encounter: Payer: Self-pay | Admitting: Family Medicine

## 2017-11-03 VITALS — BP 114/62 | HR 95 | Temp 97.5°F | Resp 18 | Wt 147.9 lb

## 2017-11-03 DIAGNOSIS — M47897 Other spondylosis, lumbosacral region: Secondary | ICD-10-CM | POA: Diagnosis not present

## 2017-11-03 DIAGNOSIS — M5442 Lumbago with sciatica, left side: Secondary | ICD-10-CM | POA: Insufficient documentation

## 2017-11-03 DIAGNOSIS — Z0100 Encounter for examination of eyes and vision without abnormal findings: Secondary | ICD-10-CM

## 2017-11-03 DIAGNOSIS — E782 Mixed hyperlipidemia: Secondary | ICD-10-CM

## 2017-11-03 DIAGNOSIS — G8929 Other chronic pain: Secondary | ICD-10-CM | POA: Diagnosis not present

## 2017-11-03 NOTE — Progress Notes (Signed)
Name: Gwendolyn Fuller   MRN: [id number]    DOB: Dec 11, 1960   Date:11/03/2017       Progress Note  Subjective  Chief Complaint  Chief Complaint  Patient presents with  . Hyperlipidemia    Discuss treatment   . Referral    Eye doctor    Hyperlipidemia  This is a recurrent problem. The problem is uncontrolled. Recent lipid tests were reviewed and are high. Exacerbating diseases include diabetes. Current antihyperlipidemic treatment includes statins. The current treatment provides moderate improvement of lipids. Risk factors for coronary artery disease include diabetes mellitus and dyslipidemia.   Pt. Also requesting a referral to the eye doctor for her routine eye exam and to update her prescription glasses, she is experiencing increased squinting and headaches, she is also due for her diabetic retinal eye exam.    Past Medical History:  Diagnosis Date  . Arthritis   . Diabetes mellitus without complication (HCC)   . Dyspnea     Past Surgical History:  Procedure Laterality Date  . CARPAL TUNNEL RELEASE Bilateral   . COLONOSCOPY WITH PROPOFOL N/A 05/22/2017   Procedure: COLONOSCOPY WITH PROPOFOL;  Surgeon: Wyline Mood, MD;  Location: [facility] ENDOSCOPY;  Service: Endoscopy;  Laterality: N/A;    History reviewed. No pertinent family history.  Social History   Socioeconomic History  . Marital status: Married    Spouse name: Not on file  . Number of children: Not on file  . Years of education: Not on file  . Highest education level: Not on file  Social Needs  . Financial resource strain: Not on file  . Food insecurity - worry: Not on file  . Food insecurity - inability: Not on file  . Transportation needs - medical: Not on file  . Transportation needs - non-medical: Not on file  Occupational History  . Not on file  Tobacco Use  . Smoking status: Current Every Day Smoker    Packs/day: 1.00    Types: Cigarettes  . Smokeless tobacco: Never Used  Substance and Sexual Activity   . Alcohol use: No  . Drug use: No  . Sexual activity: Yes  Other Topics Concern  . Not on file  Social History Narrative  . Not on file     Current Outpatient Medications:  .  albuterol (PROAIR HFA) 108 (90 Base) MCG/ACT inhaler, USE 2 PUFFS FOUR TIMES DAILY AS NEEDED, Disp: 8.5 g, Rfl: 2 .  budesonide-formoterol (SYMBICORT) 160-4.5 MCG/ACT inhaler, Inhale 2 puffs into the lungs 2 (two) times daily., Disp: 3 Inhaler, Rfl: 3 .  gabapentin (NEURONTIN) 300 MG capsule, Take 1 capsule (300 mg total) by mouth 2 (two) times daily., Disp: 180 capsule, Rfl: 0 .  glimepiride (AMARYL) 4 MG tablet, TAKE TWO TABLETS BY MOUTH DAILY WITH BREAKFAST, Disp: 60 tablet, Rfl: 2 .  glucose blood (ACCU-CHEK AVIVA PLUS) test strip, Use as instructed to check blood sugars 2 times daily., Disp: 180 each, Rfl: 2 .  insulin degludec (TRESIBA) 100 UNIT/ML SOPN FlexTouch Pen, Inject 0.4 mLs (40 Units total) into the skin daily at 10 pm., Disp: 10 pen, Rfl: 3 .  Lancets (ACCU-CHEK SOFT TOUCH) lancets, Use as directed to check blood glucose twice daily, Disp: 100 each, Rfl: 12 .  Lidocaine HCl 2 % SOLN, Use as directed in the mouth or throat., Disp: , Rfl:  .  lisinopril (PRINIVIL,ZESTRIL) 10 MG tablet, Take 1 tablet (10 mg total) by mouth daily., Disp: 90 tablet, Rfl: 0 .  rosuvastatin (CRESTOR) 40  MG tablet, Take 40 mg by mouth at bedtime., Disp: , Rfl:  .  sitaGLIPtin-metformin (JANUMET) 50-1000 MG tablet, Take 1 tablet by mouth 2 (two) times daily with a meal., Disp: 180 tablet, Rfl: 0 .  tiZANidine (ZANAFLEX) 2 MG tablet, TAKE 1 TABLET BY MOUTH THREE TIMES DAILY, Disp: 90 tablet, Rfl: 2 .  UNIFINE PENTIPS 32G X 4 MM MISC, Use as directed with Evaristo Buryresiba., Disp: 200 each, Rfl: 2  Current Facility-Administered Medications:  .  cefTRIAXone (ROCEPHIN) injection 500 mg, 500 mg, Intramuscular, Once, Dennison MascotMorrisey, Lemont, MD .  cefTRIAXone (ROCEPHIN) injection 500 mg, 500 mg, Intramuscular, Once, Dennison MascotMorrisey, Lemont, MD .   dextromethorphan (DELSYM) 30 MG/5ML liquid 30 mg, 30 mg, Oral, BID, Dennison MascotMorrisey, Lemont, MD  No Known Allergies   ROS    Objective  Vitals:   11/03/17 1043  BP: 114/62  Pulse: 95  Resp: 18  Temp: (!) 97.5 F (36.4 C)  TempSrc: Oral  SpO2: 99%  Weight: 147 lb 14.4 oz (67.1 kg)    Physical Exam  Constitutional: [name], place, and time and well-developed, well-nourished, and in no distress.  Cardiovascular: Normal rate, regular rhythm and normal heart sounds.  No murmur heard. Pulmonary/Chest: Effort normal and breath sounds normal.  Abdominal: Soft. Bowel sounds are normal. There is no tenderness.  Neurological: She is alert and oriented to person, place, and time.  Psychiatric: Mood, memory, affect and judgment normal.  Vitals reviewed.      Recent Results (from the past 2160 hour(s))  POCT HgB A1C     Status: Abnormal   Collection Time: 10/26/17  9:58 AM  Result Value Ref Range   Hemoglobin A1C 7.8   POCT Glucose (CBG)     Status: Abnormal   Collection Time: 10/26/17  9:59 AM  Result Value Ref Range   POC Glucose 235 (A) 70 - 99 mg/dl  Lipid panel     Status: Abnormal   Collection Time: 10/26/17 10:24 AM  Result Value Ref Range   Cholesterol 253 (H) <200 mg/dL   HDL 36 (L) >16>50 mg/dL   Triglycerides 109746 (H) <150 mg/dL   LDL Cholesterol (Calc)  mg/dL (calc)    Comment: . LDL cholesterol not calculated. Triglyceride levels greater than 400 mg/dL invalidate calculated LDL results. . Reference range: <100 . Desirable range <100 mg/dL for primary prevention;   <70 mg/dL for patients with CHD or diabetic patients  with > or = 2 CHD risk factors. Marland Kitchen. LDL-C is now calculated using the Martin-Hopkins  calculation, which is a validated novel method providing  better accuracy than the Friedewald equation in the  estimation of LDL-C.  Horald PollenMartin SS et al. Lenox AhrJAMA. 6045;409(812013;310(19): 2061-2068  (http://education.[facility].com/faq/FAQ164)    Total  CHOL/HDL Ratio 7.0 (H) <5.0 (calc)   Non-HDL Cholesterol (Calc) 217 (H) <130 mg/dL (calc)    Comment: For patients with diabetes plus 1 major ASCVD risk  factor, treating to a non-HDL-C goal of <100 mg/dL  (LDL-C of <19<70 mg/dL) is considered a therapeutic  option.      Assessment & Plan  1. Hypercholesterolemia with hypertriglyceridemia Patient was nonfasting at the time of fasting lipid panel lab work last week, we'll reorder FLP today, educated on potential signs of pancreatitis, will review labs and consider starting on triglyceride lowering therapy - Lipid panel  2. Encounter for complete eye exam  - Ambulatory referral to Ophthalmology   [facility] Asad A. Faylene KurtzShah [facility] 11/03/2017 10:55 AM

## 2017-11-04 LAB — LIPID PANEL
CHOLESTEROL: 179 mg/dL (ref <200)
HDL: 37 mg/dL — AB (ref >50)
LDL Cholesterol (Calc): 106 mg/dL (calc) — ABNORMAL HIGH
Non-HDL Cholesterol (Calc): 142 mg/dL (calc) — ABNORMAL HIGH (ref <130)
TRIGLYCERIDES: 251 mg/dL — AB (ref <150)
Total CHOL/HDL Ratio: 4.8 (calc) (ref <5.0)

## 2017-11-09 ENCOUNTER — Telehealth: Payer: Self-pay

## 2017-11-09 NOTE — Telephone Encounter (Signed)
Copied from CRM [id number]. Topic: General - Other >> Nov 06, 2017 12:54 PM Stephannie LiSimmons, Janett L, NT wrote: Reason for CRM: Patient says she she has seen a specialist ,and  prescription for a new back brace.  >> Nov 06, 2017 12:59 PM Stephannie LiSimmons, Janett L, NT wrote: Patient does not want to see another specialist, and would like a new prescription for a back brace

## 2017-11-09 NOTE — Telephone Encounter (Addendum)
We have not discussed a back brace, she will need an appointment for documentation.

## 2017-11-10 NOTE — Telephone Encounter (Signed)
I have tried contacting patient several times but every time I call all I get is a busy tone.

## 2017-12-01 LAB — HM DIABETES EYE EXAM

## 2017-12-18 ENCOUNTER — Other Ambulatory Visit: Payer: Self-pay

## 2017-12-18 DIAGNOSIS — E1140 Type 2 diabetes mellitus with diabetic neuropathy, unspecified: Secondary | ICD-10-CM

## 2017-12-18 DIAGNOSIS — E1165 Type 2 diabetes mellitus with hyperglycemia: Principal | ICD-10-CM

## 2017-12-18 DIAGNOSIS — IMO0002 Reserved for concepts with insufficient information to code with codable children: Secondary | ICD-10-CM

## 2017-12-19 MED ORDER — GABAPENTIN 300 MG PO CAPS: 300.0000 mg | ORAL_CAPSULE | Freq: Two times a day (BID) | ORAL | 1 refills | Status: DC

## 2018-01-22 ENCOUNTER — Ambulatory Visit: Payer: Medicaid Other | Admitting: Family Medicine

## 2018-01-22 ENCOUNTER — Encounter: Payer: Self-pay | Admitting: Family Medicine

## 2018-01-22 DIAGNOSIS — Z720 Tobacco use: Secondary | ICD-10-CM

## 2018-01-22 DIAGNOSIS — E782 Mixed hyperlipidemia: Secondary | ICD-10-CM

## 2018-01-22 DIAGNOSIS — E1140 Type 2 diabetes mellitus with diabetic neuropathy, unspecified: Secondary | ICD-10-CM | POA: Diagnosis not present

## 2018-01-22 DIAGNOSIS — J411 Mucopurulent chronic bronchitis: Secondary | ICD-10-CM

## 2018-01-22 DIAGNOSIS — E1165 Type 2 diabetes mellitus with hyperglycemia: Secondary | ICD-10-CM

## 2018-01-22 DIAGNOSIS — G8929 Other chronic pain: Secondary | ICD-10-CM

## 2018-01-22 DIAGNOSIS — M545 Low back pain, unspecified: Secondary | ICD-10-CM | POA: Insufficient documentation

## 2018-01-22 DIAGNOSIS — Z5181 Encounter for therapeutic drug level monitoring: Secondary | ICD-10-CM

## 2018-01-22 DIAGNOSIS — IMO0002 Reserved for concepts with insufficient information to code with codable children: Secondary | ICD-10-CM

## 2018-01-22 DIAGNOSIS — G5603 Carpal tunnel syndrome, bilateral upper limbs: Secondary | ICD-10-CM

## 2018-01-22 DIAGNOSIS — M5442 Lumbago with sciatica, left side: Secondary | ICD-10-CM | POA: Diagnosis not present

## 2018-01-22 DIAGNOSIS — E559 Vitamin D deficiency, unspecified: Secondary | ICD-10-CM

## 2018-01-22 MED ORDER — LUMBAR BACK BRACE/SUPPORT PAD MISC: 0 refills | Status: DC

## 2018-01-22 MED ORDER — TIZANIDINE HCL 2 MG PO TABS: 2.0000 mg | ORAL_TABLET | Freq: Three times a day (TID) | ORAL | 2 refills | Status: DC

## 2018-01-22 NOTE — Assessment & Plan Note (Signed)
Continue gabapentin; she has done injections; she finds back brace helpful, needs new one

## 2018-01-22 NOTE — Progress Notes (Signed)
BP 124/64   Pulse 94   Temp 97.7 F (36.5 C) (Oral)   Resp 16   Ht 5' 1.5" (1.562 m)   Wt 151 lb 3.2 oz (68.6 kg)   SpO2 96%   BMI 28.11 kg/m    Subjective:    Patient ID: Gwendolyn Fuller, female    DOB: 1961/08/11, 57 y.o.   MRN: [id number]  HPI: Gwendolyn Fuller is a 57 y.o. female  Chief Complaint  Patient presents with  . Follow-up    3 month  . Medication Refill    HPI Patient is new to me; previous provider left our practice  Type 2 diabetes on insulin and oral agents Lab Results  Component Value Date   HGBA1C 7.8 10/26/2017   COPD Coughs a lot still, even though essentially quit smoking; using cough drops; uses inhaler  Hyperlipidemia Lab Results  Component Value Date   CHOL 179 11/04/2017   HDL 37 (L) 11/04/2017   LDLCALC 106 (H) 11/04/2017   TRIG 251 (H) 11/04/2017   CHOLHDL 4.8 11/04/2017   Vitamin D deficiency Last two levels were 13 in May 2018 and then 18 in June 2017  Tobacco abuse She has basically quit; just one in a blue moon and starts coughing and has to stop  Cervical spine pain and lower back pain; was doing injections but couldn't wake up after two procedures; used to be on oxycontin and couldn't deal with it; she was seeing Dr. Metta Clines and then saw surgeon, but was told surgery would only be 50:50 successful; the back brace helps; topicals like flex cream and ben gay, cream from [place], gets really hot, mixes with vaseline; has to use shower, cannot get into tub; dealing with this for a long time  Carpal tunnel again; wearing brace at night, takes it off for the day, then on and off; went to the ER; taking the gabapentin for that; had surgery before on the right and left; still bothering her  Depression screen [facility] 2/9 01/22/2018 11/03/2017 10/26/2017 07/01/2017 03/25/2017  Decreased Interest 0 0 0 0 0  Down, Depressed, Hopeless 0 0 0 0 0  PHQ - 2 Score 0 0 0 0 0    Relevant past medical, surgical, family and social history reviewed Past  Medical History:  Diagnosis Date  . Arthritis   . Diabetes mellitus without complication (HCC)   . Dyspnea    Past Surgical History:  Procedure Laterality Date  . CARPAL TUNNEL RELEASE Bilateral   . COLONOSCOPY WITH PROPOFOL N/A 05/22/2017   Procedure: COLONOSCOPY WITH PROPOFOL;  Surgeon: Wyline Mood, MD;  Location: [facility] ENDOSCOPY;  Service: Endoscopy;  Laterality: N/A;   Family History  Problem Relation Age of Onset  . Diabetes Mother   . Cancer Mother        colon  . Heart disease Mother   . Hypertension Mother   . Diabetes Father   . Parkinson's disease Father   . Alzheimer's disease Father   . Diabetes Maternal Grandmother   . Diabetes Paternal Grandmother   . Cancer Paternal Grandfather        stamoach?   Social History   Tobacco Use  . Smoking status: Current Some Day Smoker    Packs/day: 1.00    Types: Cigarettes  . Smokeless tobacco: Never Used  Substance Use Topics  . Alcohol use: No  . Drug use: No    Interim medical history since last visit reviewed. Allergies and medications reviewed  Review of Systems  Per HPI unless specifically indicated above     Objective:    BP 124/64   Pulse 94   Temp 97.7 F (36.5 C) (Oral)   Resp 16   Ht 5' 1.5" (1.562 m)   Wt 151 lb 3.2 oz (68.6 kg)   SpO2 96%   BMI 28.11 kg/m   Wt Readings from Last 3 Encounters:  01/22/18 151 lb 3.2 oz (68.6 kg)  11/03/17 147 lb 14.4 oz (67.1 kg)  10/26/17 146 lb 14.4 oz (66.6 kg)    Physical Exam  Constitutional: She appears well-developed and well-nourished. No distress.  HENT:  Head: Normocephalic and atraumatic.  Eyes: EOM are normal. No scleral icterus.  Neck: No thyromegaly present.  Cardiovascular: Normal rate, regular rhythm and normal heart sounds.  No murmur heard. Pulmonary/Chest: Effort normal and breath sounds normal.  Abdominal: Soft. Bowel sounds are normal. She exhibits no distension.  Musculoskeletal: She exhibits no edema.  Neurological: She is alert.  She exhibits normal muscle tone.  Skin: Skin is warm and dry. She is not diaphoretic. No pallor.  Psychiatric: She has a normal mood and affect. Her behavior is normal. Judgment and thought content normal.    Results for orders placed or performed in visit on 11/03/17  Lipid panel  Result Value Ref Range   Cholesterol 179 <200 mg/dL   HDL 37 (L) >16 mg/dL   Triglycerides 109 (H) <150 mg/dL   LDL Cholesterol (Calc) 106 (H) mg/dL (calc)   Total CHOL/HDL Ratio 4.8 <5.0 (calc)   Non-HDL Cholesterol (Calc) 142 (H) <130 mg/dL (calc)      Assessment & Plan:   Problem List Items Addressed This Visit      Respiratory   COLD (chronic obstructive lung disease) (HCC)    Encouraged smoking cessation completely        Endocrine   Uncontrolled type 2 diabetes with neuropathy (HCC)    Check labs; foot exam by MD      Relevant Orders   Microalbumin / creatinine urine ratio   Lipid panel   Hemoglobin A1c     Nervous and Auditory   Carpal tunnel syndrome, bilateral    Gabapentin, wear brace      Relevant Medications   tiZANidine (ZANAFLEX) 2 MG tablet     Other   Chronic lower back pain    Continue gabapentin; she has done injections; she finds back brace helpful, needs new one      Relevant Medications   tiZANidine (ZANAFLEX) 2 MG tablet   Vitamin D deficiency    Check level to see if further replacement needed      Relevant Orders   VITAMIN D 25 Hydroxy (Vit-D Deficiency, Fractures)   Tobacco abuse    Encouraged complete cessation      Medication monitoring encounter    Check labs to monitor liver and kidneys      Relevant Orders   COMPLETE METABOLIC PANEL WITH GFR   Hyperlipidemia    Check labs; encouraged diet low in saturated fats      Relevant Orders   Lipid panel       Follow up plan: Return in about 2 weeks (around 02/04/2018) for fasting labs only; return to see Dr. Sherie Don after July 11th.  An after-visit summary was printed and given to the patient  at check-out.  Please see the patient instructions which may contain other information and recommendations beyond what is mentioned above in the assessment and plan.  Meds ordered this encounter  Medications  . Elastic Bandages & Supports (LUMBAR BACK BRACE/SUPPORT PAD) MISC    Sig: Back brace of choice; LON 99 months; Dx chronic low back pain    Dispense:  1 each    Refill:  0  . tiZANidine (ZANAFLEX) 2 MG tablet    Sig: Take 1 tablet (2 mg total) by mouth 3 (three) times daily. If needed    Dispense:  90 tablet    Refill:  2    Orders Placed This Encounter  Procedures  . VITAMIN D 25 Hydroxy (Vit-D Deficiency, Fractures)  . Microalbumin / creatinine urine ratio  . Lipid panel  . Hemoglobin A1c  . COMPLETE METABOLIC PANEL WITH GFR

## 2018-01-22 NOTE — Patient Instructions (Signed)
Return in April for labs only I do encourage you to quit smoking Call [phone] to sign up for smoking cessation classes You can call 1-800-QUIT-NOW to talk with a smoking cessation coach Please do see your eye doctor regularly, and have your eyes examined every year (or more often per his or her recommendation) Check your feet every night and let me know right away of any sores, infections, numbness, etc. Try to limit sweets, white bread, white rice, white potatoesTry to follow the DASH guidelines (DASH stands for Dietary Approaches to Stop Hypertension). Try to limit the sodium in your diet to no more than 1,500mg  of sodium per day. Certainly try to not exceed 2,000 mg per day at the very most. Do not add salt when cooking or at the table.  Check the sodium amount on labels when shopping, and choose items lower in sodium when given a choice. Avoid or limit foods that already contain a lot of sodium. Eat a diet rich in fruits and vegetables and whole grains, and try to lose weight if overweight or obese

## 2018-01-27 ENCOUNTER — Encounter: Payer: Self-pay | Admitting: Family Medicine

## 2018-01-27 DIAGNOSIS — Z5181 Encounter for therapeutic drug level monitoring: Secondary | ICD-10-CM | POA: Insufficient documentation

## 2018-01-27 NOTE — Assessment & Plan Note (Signed)
Encouraged smoking cessation completely

## 2018-01-27 NOTE — Assessment & Plan Note (Signed)
Encouraged complete cessation. 

## 2018-01-27 NOTE — Assessment & Plan Note (Signed)
Check labs; foot exam by MD

## 2018-01-27 NOTE — Assessment & Plan Note (Signed)
Check labs to monitor liver and kidneys

## 2018-01-27 NOTE — Assessment & Plan Note (Signed)
Check level to see if further replacement needed

## 2018-01-27 NOTE — Assessment & Plan Note (Signed)
Check labs; encouraged diet low in saturated fats

## 2018-01-27 NOTE — Assessment & Plan Note (Signed)
Gabapentin, wear brace

## 2018-02-18 ENCOUNTER — Emergency Department
Admission: EM | Admit: 2018-02-18 | Discharge: 2018-02-18 | Disposition: A | Discharge status: Home / Self Care | Payer: Medicaid Other | Attending: Emergency Medicine | Admitting: Emergency Medicine

## 2018-02-18 ENCOUNTER — Encounter: Payer: Self-pay | Admitting: Emergency Medicine

## 2018-02-18 DIAGNOSIS — W57XXXA Bitten or stung by nonvenomous insect and other nonvenomous arthropods, initial encounter: Secondary | ICD-10-CM | POA: Insufficient documentation

## 2018-02-18 DIAGNOSIS — F17210 Nicotine dependence, cigarettes, uncomplicated: Secondary | ICD-10-CM | POA: Diagnosis not present

## 2018-02-18 DIAGNOSIS — Z0489 Encounter for examination and observation for other specified reasons: Secondary | ICD-10-CM | POA: Insufficient documentation

## 2018-02-18 DIAGNOSIS — Z79899 Other long term (current) drug therapy: Secondary | ICD-10-CM | POA: Diagnosis not present

## 2018-02-18 DIAGNOSIS — J449 Chronic obstructive pulmonary disease, unspecified: Secondary | ICD-10-CM | POA: Diagnosis not present

## 2018-02-18 DIAGNOSIS — Z794 Long term (current) use of insulin: Secondary | ICD-10-CM | POA: Insufficient documentation

## 2018-02-18 DIAGNOSIS — I10 Essential (primary) hypertension: Secondary | ICD-10-CM | POA: Diagnosis not present

## 2018-02-18 DIAGNOSIS — E119 Type 2 diabetes mellitus without complications: Secondary | ICD-10-CM | POA: Insufficient documentation

## 2018-02-18 MED ORDER — DOXYCYCLINE HYCLATE 100 MG PO TABS
200.0000 mg | ORAL_TABLET | Freq: Once | ORAL | 0 refills | Status: AC
Start: 2018-02-18 — End: 2018-02-18

## 2018-02-18 NOTE — ED Provider Notes (Signed)
[facility] Emergency Department Provider Note ____________________________________________  Time seen: 1647  I have reviewed the triage vital signs and the nursing notes.  HISTORY  Chief Complaint  Insect Bite  HPI Gwendolyn Fuller is a 57 y.o. female presents herself to the ED for evaluation after multiple tick bites.  Patient describes she is been outside for the last few days working in her garden.  She is had approximately 5 different tick bite exposures over the last several days.  She denies any fevers, chills, sweats.  Also denies any joint pain, muscle pain, or focal rashes.  Past Medical History:  Diagnosis Date  . Arthritis   . Diabetes mellitus without complication (HCC)   . Dyspnea     Patient Active Problem List   Diagnosis Date Noted  . Medication monitoring encounter 01/27/2018  . Chronic lower back pain 01/22/2018  . Screening for colon cancer 03/31/2017  . Vitamin D deficiency 04/22/2016  . Carpal tunnel syndrome, bilateral 04/21/2016  . Uncontrolled type 2 diabetes with neuropathy (HCC) 06/19/2015  . Essential hypertension 06/19/2015  . Hyperlipidemia 06/19/2015  . COLD (chronic obstructive lung disease) (HCC) 06/19/2015  . Tobacco abuse 06/19/2015    Past Surgical History:  Procedure Laterality Date  . CARPAL TUNNEL RELEASE Bilateral   . COLONOSCOPY WITH PROPOFOL N/A 05/22/2017   Procedure: COLONOSCOPY WITH PROPOFOL;  Surgeon: Wyline MoodAnna, Kiran, MD;  [facility];  Service: Endoscopy;  Laterality: N/A;    Prior to Admission medications   Medication Sig Start Date End Date Taking? Authorizing Provider  albuterol (PROAIR HFA) 108 (90 Base) MCG/ACT inhaler USE 2 PUFFS FOUR TIMES DAILY AS NEEDED 11/02/17   Ellyn HackShah, Syed Asad A, MD  budesonide-formoterol [facility](SYMBICORT) 160-4.5 MCG/ACT inhaler Inhale 2 puffs into the lungs 2 (two) times daily. 02/09/17   Ellyn HackShah, Syed Asad A, MD  Elastic Bandages & Supports (LUMBAR BACK BRACE/SUPPORT PAD)  MISC Back brace of choice; LON 99 months; Dx chronic low back pain 01/22/18   Lada, Janit BernMelinda P, MD  gabapentin (NEURONTIN) 300 MG capsule Take 1 capsule (300 mg total) by mouth 2 (two) times daily. 12/19/17   Kerman PasseyLada, Melinda P, MD  glimepiride (AMARYL) 4 MG tablet TAKE TWO TABLETS BY MOUTH DAILY WITH BREAKFAST 10/26/17   Velta AddisonShah, Syed Asad A, MD  glucose blood (ACCU-CHEK AVIVA PLUS) test strip Use as instructed to check blood sugars 2 times daily. 07/01/17   Ellyn HackShah, Syed Asad A, MD  insulin degludec (TRESIBA) 100 UNIT/ML SOPN FlexTouch Pen Inject 0.4 mLs (40 Units total) into the skin daily at 10 pm. 10/26/17   Ellyn HackShah, Syed Asad A, MD  Lancets (ACCU-CHEK SOFT [facility]) lancets Use as directed to check blood glucose twice daily 07/01/17   Velta AddisonShah, Syed Asad A, MD  Lidocaine HCl 2 % SOLN Use as directed in the mouth or throat.    [provider]  lisinopril (PRINIVIL,ZESTRIL) 10 MG tablet Take 1 tablet (10 mg total) by mouth daily. 10/26/17   Ellyn HackShah, Syed Asad A, MD  rosuvastatin (CRESTOR) 40 MG tablet Take 40 mg by mouth at bedtime.    [provider]  sitaGLIPtin-metformin (JANUMET) 50-1000 MG tablet Take 1 tablet by mouth 2 (two) times daily with a meal. 10/26/17   Ellyn HackShah, Syed Asad A, MD  tiZANidine (ZANAFLEX) 2 MG tablet Take 1 tablet (2 mg total) by mouth 3 (three) times daily. If needed 01/22/18   Lada, Janit BernMelinda P, MD  UNIFINE PENTIPS 32G X 4 MM MISC Use as directed with [place]. 07/01/17  Ellyn Hack, MD    Allergies Patient has no known allergies.  Family History  Problem Relation Age of Onset  . Diabetes Mother   . Cancer Mother        colon  . Heart disease Mother   . Hypertension Mother   . Diabetes Father   . Parkinson's disease Father   . Alzheimer's disease Father   . Diabetes Maternal Grandmother   . Diabetes Paternal Grandmother   . Cancer Paternal Grandfather        stamoach?    Social History Social History   Tobacco Use  . Smoking status: Current Some Day Smoker     Packs/day: 1.00    Types: Cigarettes  . Smokeless tobacco: Never Used  Substance Use Topics  . Alcohol use: No  . Drug use: No    Review of Systems  Constitutional: Negative for fever. Cardiovascular: Negative for chest pain. Respiratory: Negative for shortness of breath. Gastrointestinal: Negative for abdominal pain, vomiting and diarrhea. Musculoskeletal: Negative for joint pain or myalgias Skin: Negative for rash. Neurological: Negative for headaches, focal weakness or numbness. ____________________________________________  PHYSICAL EXAM:  VITAL SIGNS: ED Triage Vitals  Enc Vitals Group     BP 02/18/18 1628 136/72     Pulse Rate 02/18/18 1628 (!) 102     Resp 02/18/18 1628 18     Temp 02/18/18 1628 97.9 F (36.6 C)     Temp Source 02/18/18 1628 Oral     SpO2 02/18/18 1628 98 %     Weight 02/18/18 1628 150 lb (68 kg)     Height 02/18/18 1628 5\' 2"  (1.575 m)     Head Circumference --      Peak Flow --      Pain Score 02/18/18 1633 9     Pain Loc --      Pain Edu? --      Excl. in GC? --     Constitutional: Alert and oriented. Well appearing and in no distress. Head: Normocephalic and atraumatic. Eyes: Conjunctivae are normal. Normal extraocular movements Cardiovascular: Normal rate, regular rhythm. Normal distal pulses. Respiratory: Normal respiratory effort. No wheezes/rales/rhonchi. Musculoskeletal: Nontender with normal range of motion in all extremities.  Neurologic:  Normal gait without ataxia. Normal speech and language. No gross focal neurologic deficits are appreciated. Skin:  Skin is warm, dry and intact. No rash noted.  No signs of cellulitis or infection from the previous tick bite areas.  No EM rash noted. ____________________________________________  INITIAL IMPRESSION / ASSESSMENT AND PLAN / ED COURSE  Patient with multiple tick bites over the last week.  No indication from her exam of any signs of tickborne illness or EM rash.  Patient had a  husband and given instruction on how to properly remove ticks if they do attach.  They have also given instructions that none of her previous tick bites appear to have caused any transition of tickborne disease.  She is given a single prescription for a one-time prophylactic dose of doxycycline.  Patient is advised at this time that her symptoms do not appear consistent with the need for prophylaxis.  She will have a prescription necessary if she should remove an engorged tick that has been present for more than 36 hours.  Patient will otherwise follow-up with her primary provider for ongoing tickborne disease symptoms as discussed. ____________________________________________  FINAL CLINICAL IMPRESSION(S) / ED DIAGNOSES  Final diagnoses:  Tick bite, initial encounter      Shourya Macpherson  Marcelyn Bruins, PA-C 02/19/18 1610    Dionne Bucy, MD 02/19/18 1521

## 2018-02-18 NOTE — ED Notes (Signed)
See triage note  Presents with several ticks bites    She has been working outside

## 2018-02-18 NOTE — ED Triage Notes (Signed)
Pt comes into the ED via POV c/o tick bites.  Patient states she has had 5 of them and has been gardening a lot lately.  Patient in NAD at this time.

## 2018-02-18 NOTE — Discharge Instructions (Signed)
You have had several tick bites, but do not appear to have any signs of a tick disease. Continue to monitor for any signs of tickborne illness. You have been given a prescription for a single prophylactic dose of doxycycline. Take this only if you find an engorged tick embedded in you skin. Keep the areas clean and dry. Follow-up with your provider for any concerns.

## 2018-03-03 ENCOUNTER — Other Ambulatory Visit: Payer: Self-pay

## 2018-03-03 DIAGNOSIS — J411 Mucopurulent chronic bronchitis: Secondary | ICD-10-CM

## 2018-03-03 MED ORDER — BUDESONIDE-FORMOTEROL FUMARATE 160-4.5 MCG/ACT IN AERO: 2.0000 | INHALATION_SPRAY | Freq: Two times a day (BID) | RESPIRATORY_TRACT | 3 refills | Status: DC

## 2018-04-28 ENCOUNTER — Other Ambulatory Visit: Payer: Self-pay

## 2018-04-28 DIAGNOSIS — IMO0002 Reserved for concepts with insufficient information to code with codable children: Secondary | ICD-10-CM

## 2018-04-28 DIAGNOSIS — E1165 Type 2 diabetes mellitus with hyperglycemia: Principal | ICD-10-CM

## 2018-04-28 DIAGNOSIS — E1140 Type 2 diabetes mellitus with diabetic neuropathy, unspecified: Secondary | ICD-10-CM

## 2018-04-28 MED ORDER — GLIMEPIRIDE 4 MG PO TABS: ORAL_TABLET | ORAL | 2 refills | Status: DC

## 2018-04-28 NOTE — Telephone Encounter (Signed)
Lab Results  Component Value Date   HGBA1C 7.8 10/26/2017   Please remind patient that she is overdue for labs We need those ASAP please

## 2018-04-28 NOTE — Telephone Encounter (Signed)
Refill request was sent to Dr. Melinda Lada for approval and submission.  

## 2018-04-30 ENCOUNTER — Other Ambulatory Visit: Payer: Self-pay

## 2018-04-30 DIAGNOSIS — E1140 Type 2 diabetes mellitus with diabetic neuropathy, unspecified: Secondary | ICD-10-CM

## 2018-04-30 DIAGNOSIS — IMO0002 Reserved for concepts with insufficient information to code with codable children: Secondary | ICD-10-CM

## 2018-04-30 DIAGNOSIS — E1165 Type 2 diabetes mellitus with hyperglycemia: Principal | ICD-10-CM

## 2018-04-30 NOTE — Telephone Encounter (Signed)
Refill request was sent to Dr. Melinda Lada for approval and submission.  

## 2018-04-30 NOTE — Telephone Encounter (Signed)
I just refilled this (glimepiride) on 04/28/18 Please resolve with pharmacy

## 2018-05-07 ENCOUNTER — Ambulatory Visit: Payer: Medicaid Other | Admitting: Family Medicine

## 2018-05-07 ENCOUNTER — Encounter: Payer: Self-pay | Admitting: Family Medicine

## 2018-05-07 VITALS — BP 116/68 | HR 98 | Temp 98.1°F | Resp 14 | Ht 62.0 in | Wt 148.2 lb

## 2018-05-07 DIAGNOSIS — E782 Mixed hyperlipidemia: Secondary | ICD-10-CM

## 2018-05-07 DIAGNOSIS — I10 Essential (primary) hypertension: Secondary | ICD-10-CM

## 2018-05-07 DIAGNOSIS — Z5181 Encounter for therapeutic drug level monitoring: Secondary | ICD-10-CM

## 2018-05-07 DIAGNOSIS — J411 Mucopurulent chronic bronchitis: Secondary | ICD-10-CM | POA: Diagnosis not present

## 2018-05-07 DIAGNOSIS — E1140 Type 2 diabetes mellitus with diabetic neuropathy, unspecified: Secondary | ICD-10-CM | POA: Diagnosis not present

## 2018-05-07 DIAGNOSIS — E1165 Type 2 diabetes mellitus with hyperglycemia: Secondary | ICD-10-CM | POA: Diagnosis not present

## 2018-05-07 DIAGNOSIS — IMO0002 Reserved for concepts with insufficient information to code with codable children: Secondary | ICD-10-CM

## 2018-05-07 MED ORDER — BLOOD GLUCOSE MONITOR KIT: PACK | 0 refills | Status: DC

## 2018-05-07 NOTE — Assessment & Plan Note (Signed)
Check lipids today; proud of her better eating efforts

## 2018-05-07 NOTE — Assessment & Plan Note (Signed)
Stable; continue inhalers as needed

## 2018-05-07 NOTE — Progress Notes (Signed)
BP 116/68   Pulse 98   Temp 98.1 F (36.7 C) (Oral)   Resp 14   Ht '5\' 2"'  (1.575 m)   Wt 148 lb 3.2 oz (67.2 kg)   SpO2 96%   BMI 27.11 kg/m    Subjective:    Patient ID: Gwendolyn Fuller, female    DOB: 07-24-1961, 57 y.o.   MRN: [id number]  HPI: Gwendolyn Fuller is a 57 y.o. female  Chief Complaint  Patient presents with  . Follow-up    HPI Patient is here for f/u Type 2 diabetes; checking sugars Sugar jumped up from 170 to 260 range after peanut butter and jelly sandwich; being very careful; more cottage cheese; heard that helps sugars; white bread; cut out sodas and drinking more water Diabetic retinopathy; father had eye problems too from diabetes; her GM in [place] went blind from her diabetes; mother has diabetes too Lab Results  Component Value Date   HGBA1C 7.8 10/26/2017   High cholesterol; she has been trying to watch her diet more carefully; previous TG were 746 and down to 251 in January 2019  Hypertension; controlled today; not checking away from here with my blessing; not much salt, none period  Mother had colon cancer; patient got colonoscopy last year and they did find polyps and got them out in time  COPD; patient does not need any refills right now; quit smoking  Depression screen [facility] 2/9 05/07/2018 01/22/2018 11/03/2017 10/26/2017 07/01/2017  Decreased Interest 0 0 0 0 0  Down, Depressed, Hopeless 0 0 0 0 0  PHQ - 2 Score 0 0 0 0 0    Relevant past medical, surgical, family and social history reviewed Past Medical History:  Diagnosis Date  . Arthritis   . Diabetes mellitus without complication ([place])   . Dyspnea    Past Surgical History:  Procedure Laterality Date  . CARPAL TUNNEL RELEASE Bilateral   . COLONOSCOPY WITH PROPOFOL N/A 05/22/2017   Procedure: COLONOSCOPY WITH PROPOFOL;  Surgeon: Jonathon Bellows, MD;  Location: [facility] ENDOSCOPY;  Service: Endoscopy;  Laterality: N/A;   Family History  Problem Relation Age of Onset  . Diabetes Mother     . Cancer Mother        colon  . Heart disease Mother   . Hypertension Mother   . Diabetes Father   . Parkinson's disease Father   . Alzheimer's disease Father   . Diabetes Maternal Grandmother   . Diabetes Paternal Grandmother   . Cancer Paternal Grandfather        stamoach?   Social History   Tobacco Use  . Smoking status: Former Smoker    Packs/day: 0.50    Years: 25.00    Pack years: 12.50    Types: Cigarettes  . Smokeless tobacco: Never Used  Substance Use Topics  . Alcohol use: No  . Drug use: No    Interim medical history since last visit reviewed. Allergies and medications reviewed  Review of Systems Per HPI unless specifically indicated above     Objective:    BP 116/68   Pulse 98   Temp 98.1 F (36.7 C) (Oral)   Resp 14   Ht '5\' 2"'  (1.575 m)   Wt 148 lb 3.2 oz (67.2 kg)   SpO2 96%   BMI 27.11 kg/m   Wt Readings from Last 3 Encounters:  05/07/18 148 lb 3.2 oz (67.2 kg)  02/18/18 150 lb (68 kg)  01/22/18 151 lb 3.2 oz (68.6 kg)  Physical Exam  Constitutional: She appears well-developed and well-nourished. No distress.  HENT:  Head: Normocephalic and atraumatic.  Eyes: EOM are normal. No scleral icterus.  Neck: No thyromegaly present.  Cardiovascular: Normal rate, regular rhythm and normal heart sounds.  No murmur heard. Pulmonary/Chest: Effort normal and breath sounds normal. No respiratory distress. She has no wheezes.  Abdominal: Soft. Bowel sounds are normal. She exhibits no distension.  Musculoskeletal: Normal range of motion. She exhibits no edema.  Neurological: She is alert. She exhibits normal muscle tone.  Skin: Skin is warm and dry. She is not diaphoretic. No pallor.  Psychiatric: She has a normal mood and affect. Her behavior is normal. Judgment and thought content normal.   Diabetic Foot Form - Detailed   Diabetic Foot Exam - detailed Diabetic Foot exam was performed with the following findings:  Yes 05/07/2018  3:37 PM  Visual  Foot Exam completed.:  Yes  Pulse Foot Exam completed.:  Yes  Right Dorsalis Pedis:  Present Left Dorsalis Pedis:  Present  Sensory Foot Exam Completed.:  Yes Semmes-Weinstein Monofilament Test R Site 1-Great Toe:  Pos L Site 1-Great Toe:  Pos    Comments:  Verrucous lesions on the bottom of the left foot     Results for orders placed or performed in visit on 11/03/17  Lipid panel  Result Value Ref Range   Cholesterol 179 <200 mg/dL   HDL 37 (L) >50 mg/dL   Triglycerides 251 (H) <150 mg/dL   LDL Cholesterol (Calc) 106 (H) mg/dL (calc)   Total CHOL/HDL Ratio 4.8 <5.0 (calc)   Non-HDL Cholesterol (Calc) 142 (H) <130 mg/dL (calc)      Assessment & Plan:   Problem List Items Addressed This Visit      Cardiovascular and Mediastinum   Essential hypertension    Well-controlled today        Respiratory   COLD (chronic obstructive lung disease) ([place])    Stable; continue inhalers as needed        Endocrine   Uncontrolled type 2 diabetes with neuropathy ([place]) - Primary    Foot exam by MD; check A1c and urine microalb:Cr; proud of better eating efforts      Relevant Orders   Hemoglobin A1C   Microalbumin / creatinine urine ratio     Other   Medication monitoring encounter    Check liver and kidney function      Relevant Orders   COMPLETE METABOLIC PANEL WITH GFR   Hyperlipidemia    Check lipids today; proud of her better eating efforts      Relevant Orders   Lipid panel       Follow up plan: Return in about 3 months (around 08/07/2018) for follow-up visit with Dr. Sanda Klein.  An after-visit summary was printed and given to the patient at [facility].  Please see the patient instructions which may contain other information and recommendations beyond what is mentioned above in the assessment and plan.  Meds ordered this encounter  Medications  . blood glucose meter kit and supplies KIT    Sig: Dispense based on patient and insurance preference. Use up to three times  daily as directed; E11.65; LON 99 months    Dispense:  1 each    Refill:  0    Order Specific Question:   Number of strips    Answer:   100    Order Specific Question:   Number of lancets    Answer:   100  Orders Placed This Encounter  Procedures  . Hemoglobin A1C  . Microalbumin / creatinine urine ratio  . Lipid panel  . COMPLETE METABOLIC PANEL WITH GFR

## 2018-05-07 NOTE — Patient Instructions (Addendum)
Try wheat bread instead of white bread Try brown rice or wild rice instead of white rice You can try Duofilm on the places on your left foot; use once a day for about a month until they are gone Keep up the great job with your healthier eating habits We'll get labs today If you have not heard anything from my staff in a week about any orders/referrals/studies from today, please contact us here to follow-up [phone]

## 2018-05-07 NOTE — Assessment & Plan Note (Signed)
Foot exam by MD; check A1c and urine microalb:Cr; proud of better eating efforts

## 2018-05-07 NOTE — Assessment & Plan Note (Signed)
Well controlled today.

## 2018-05-07 NOTE — Assessment & Plan Note (Signed)
Check liver and kidney function 

## 2018-05-08 LAB — COMPLETE METABOLIC PANEL WITH GFR
AG Ratio: 1.5 (calc) (ref 1.0–2.5)
ALT: 21 U/L (ref 6–29)
AST: 13 U/L (ref 10–35)
Albumin: 4.4 g/dL (ref 3.6–5.1)
Alkaline phosphatase (APISO): 120 U/L (ref 33–130)
BUN: 13 mg/dL (ref 7–25)
CALCIUM: 10 mg/dL (ref 8.6–10.4)
CHLORIDE: 103 mmol/L (ref 98–110)
CO2: 24 mmol/L (ref 20–32)
Creat: 0.66 mg/dL (ref 0.50–1.05)
GFR, EST NON AFRICAN AMERICAN: 97 mL/min/{1.73_m2} (ref >=60)
GFR, Est African American: 113 mL/min/{1.73_m2} (ref >=60)
GLUCOSE: 181 mg/dL — AB (ref 65–139)
Globulin: 3 g/dL (calc) (ref 1.9–3.7)
Potassium: 4.2 mmol/L (ref 3.5–5.3)
Sodium: 136 mmol/L (ref 135–146)
TOTAL PROTEIN: 7.4 g/dL (ref 6.1–8.1)
Total Bilirubin: 0.5 mg/dL (ref 0.2–1.2)

## 2018-05-08 LAB — LIPID PANEL
CHOLESTEROL: 200 mg/dL — AB (ref <200)
HDL: 37 mg/dL — AB (ref >50)
LDL CHOLESTEROL (CALC): 121 mg/dL — AB
Non-HDL Cholesterol (Calc): 163 mg/dL (calc) — ABNORMAL HIGH (ref <130)
TRIGLYCERIDES: 272 mg/dL — AB (ref <150)
Total CHOL/HDL Ratio: 5.4 (calc) — ABNORMAL HIGH (ref <5.0)

## 2018-05-08 LAB — MICROALBUMIN / CREATININE URINE RATIO
Creatinine, Urine: 56 mg/dL (ref 20–275)
Microalb Creat Ratio: 13 mcg/mg creat (ref <30)
Microalb, Ur: 0.7 mg/dL

## 2018-05-08 LAB — HEMOGLOBIN A1C
EAG (MMOL/L): 10.8 (calc)
Hgb A1c MFr Bld: 8.4 % of total Hgb — ABNORMAL HIGH (ref <5.7)
MEAN PLASMA GLUCOSE: 194 (calc)

## 2018-05-17 ENCOUNTER — Other Ambulatory Visit: Payer: Self-pay | Admitting: Family Medicine

## 2018-05-17 DIAGNOSIS — IMO0002 Reserved for concepts with insufficient information to code with codable children: Secondary | ICD-10-CM

## 2018-05-17 DIAGNOSIS — E1140 Type 2 diabetes mellitus with diabetic neuropathy, unspecified: Secondary | ICD-10-CM

## 2018-05-17 DIAGNOSIS — E1165 Type 2 diabetes mellitus with hyperglycemia: Principal | ICD-10-CM

## 2018-05-17 MED ORDER — ROSUVASTATIN CALCIUM 20 MG PO TABS: 20.0000 mg | ORAL_TABLET | Freq: Every day | ORAL | 0 refills | Status: DC

## 2018-05-17 MED ORDER — SITAGLIPTIN PHOS-METFORMIN HCL 50-1000 MG PO TABS: 1.0000 | ORAL_TABLET | Freq: Two times a day (BID) | ORAL | 0 refills | Status: DC

## 2018-05-17 NOTE — Progress Notes (Signed)
New prescriptions for crestor and janumet sent

## 2018-06-17 ENCOUNTER — Other Ambulatory Visit: Payer: Self-pay

## 2018-06-17 DIAGNOSIS — E1140 Type 2 diabetes mellitus with diabetic neuropathy, unspecified: Secondary | ICD-10-CM

## 2018-06-17 DIAGNOSIS — M5442 Lumbago with sciatica, left side: Secondary | ICD-10-CM

## 2018-06-17 DIAGNOSIS — J411 Mucopurulent chronic bronchitis: Secondary | ICD-10-CM

## 2018-06-17 DIAGNOSIS — IMO0002 Reserved for concepts with insufficient information to code with codable children: Secondary | ICD-10-CM

## 2018-06-17 DIAGNOSIS — G8929 Other chronic pain: Secondary | ICD-10-CM

## 2018-06-17 DIAGNOSIS — E1165 Type 2 diabetes mellitus with hyperglycemia: Secondary | ICD-10-CM

## 2018-06-17 MED ORDER — GABAPENTIN 300 MG PO CAPS: 300.0000 mg | ORAL_CAPSULE | Freq: Two times a day (BID) | ORAL | 1 refills | Status: DC

## 2018-06-17 MED ORDER — TIZANIDINE HCL 2 MG PO TABS: 2.0000 mg | ORAL_TABLET | Freq: Three times a day (TID) | ORAL | 1 refills | Status: DC

## 2018-06-17 MED ORDER — ALBUTEROL SULFATE HFA 108 (90 BASE) MCG/ACT IN AERS: INHALATION_SPRAY | RESPIRATORY_TRACT | 0 refills | Status: DC

## 2018-06-30 ENCOUNTER — Other Ambulatory Visit: Payer: Self-pay | Admitting: Family Medicine

## 2018-06-30 DIAGNOSIS — IMO0002 Reserved for concepts with insufficient information to code with codable children: Secondary | ICD-10-CM

## 2018-06-30 DIAGNOSIS — E1140 Type 2 diabetes mellitus with diabetic neuropathy, unspecified: Secondary | ICD-10-CM

## 2018-06-30 DIAGNOSIS — E1165 Type 2 diabetes mellitus with hyperglycemia: Principal | ICD-10-CM

## 2018-06-30 NOTE — Telephone Encounter (Signed)
Lat OV: 05/07/18 Next: 08/16/18

## 2018-07-01 ENCOUNTER — Other Ambulatory Visit: Payer: Self-pay | Admitting: Family Medicine

## 2018-07-01 DIAGNOSIS — E1165 Type 2 diabetes mellitus with hyperglycemia: Principal | ICD-10-CM

## 2018-07-01 DIAGNOSIS — IMO0002 Reserved for concepts with insufficient information to code with codable children: Secondary | ICD-10-CM

## 2018-07-01 DIAGNOSIS — E1140 Type 2 diabetes mellitus with diabetic neuropathy, unspecified: Secondary | ICD-10-CM

## 2018-07-21 ENCOUNTER — Telehealth: Payer: Self-pay | Admitting: Family Medicine

## 2018-07-21 MED ORDER — INSULIN DETEMIR 100 UNIT/ML FLEXPEN: 40.0000 [IU] | PEN_INJECTOR | Freq: Every day | SUBCUTANEOUS | 0 refills | Status: DC

## 2018-07-21 NOTE — Telephone Encounter (Signed)
Note from pharmacy: [place] not covered I called pt to discuss; left message, switching insulin, should work about as well, same instructions; call with any problems

## 2018-07-23 ENCOUNTER — Other Ambulatory Visit: Payer: Self-pay

## 2018-07-23 ENCOUNTER — Emergency Department: Payer: Medicaid Other

## 2018-07-23 ENCOUNTER — Emergency Department
Admission: EM | Admit: 2018-07-23 | Discharge: 2018-07-24 | Disposition: A | Discharge status: Home / Self Care | Payer: Medicaid Other | Attending: Emergency Medicine | Admitting: Emergency Medicine

## 2018-07-23 ENCOUNTER — Encounter: Payer: Self-pay | Admitting: Emergency Medicine

## 2018-07-23 DIAGNOSIS — E1140 Type 2 diabetes mellitus with diabetic neuropathy, unspecified: Secondary | ICD-10-CM | POA: Insufficient documentation

## 2018-07-23 DIAGNOSIS — R202 Paresthesia of skin: Secondary | ICD-10-CM | POA: Insufficient documentation

## 2018-07-23 DIAGNOSIS — Z794 Long term (current) use of insulin: Secondary | ICD-10-CM | POA: Insufficient documentation

## 2018-07-23 DIAGNOSIS — M5412 Radiculopathy, cervical region: Secondary | ICD-10-CM | POA: Insufficient documentation

## 2018-07-23 DIAGNOSIS — Z87891 Personal history of nicotine dependence: Secondary | ICD-10-CM | POA: Insufficient documentation

## 2018-07-23 DIAGNOSIS — I10 Essential (primary) hypertension: Secondary | ICD-10-CM | POA: Diagnosis not present

## 2018-07-23 DIAGNOSIS — Z79899 Other long term (current) drug therapy: Secondary | ICD-10-CM | POA: Diagnosis not present

## 2018-07-23 DIAGNOSIS — M542 Cervicalgia: Secondary | ICD-10-CM | POA: Diagnosis present

## 2018-07-23 LAB — CBC
HCT: 43.9 % (ref 35.0–47.0)
HEMOGLOBIN: 15.5 g/dL (ref 12.0–16.0)
MCH: 29.8 pg (ref 26.0–34.0)
MCHC: 35.3 g/dL (ref 32.0–36.0)
MCV: 84.6 fL (ref 80.0–100.0)
Platelets: 291 10*3/uL (ref 150–440)
RBC: 5.19 MIL/uL (ref 3.80–5.20)
RDW: 14.3 % (ref 11.5–14.5)
WBC: 12.3 10*3/uL — ABNORMAL HIGH (ref 3.6–11.0)

## 2018-07-23 LAB — BASIC METABOLIC PANEL
ANION GAP: 9 (ref 5–15)
BUN: 14 mg/dL (ref 6–20)
CALCIUM: 9.9 mg/dL (ref 8.9–10.3)
CO2: 27 mmol/L (ref 22–32)
Chloride: 105 mmol/L (ref 98–111)
Creatinine, Ser: 0.86 mg/dL (ref 0.44–1.00)
GFR calc Af Amer: >60 mL/min (ref >60)
GFR calc non Af Amer: >60 mL/min (ref >60)
GLUCOSE: 200 mg/dL — AB (ref 70–99)
Potassium: 3.9 mmol/L (ref 3.5–5.1)
Sodium: 141 mmol/L (ref 135–145)

## 2018-07-23 LAB — TROPONIN I

## 2018-07-23 LAB — GLUCOSE, CAPILLARY: Glucose-Capillary: 196 mg/dL — ABNORMAL HIGH (ref 70–99)

## 2018-07-23 MED ORDER — IBUPROFEN 600 MG PO TABS
600.0000 mg | ORAL_TABLET | ORAL | Status: AC
  Administered 2018-07-23: 600 mg via ORAL
  Filled 2018-07-23: qty 1

## 2018-07-23 MED ORDER — HYDROCODONE-ACETAMINOPHEN 5-325 MG PO TABS
1.0000 | ORAL_TABLET | ORAL | Status: AC
  Administered 2018-07-23: 1 via ORAL
  Filled 2018-07-23: qty 1

## 2018-07-23 NOTE — ED Triage Notes (Signed)
Pt comes into the ED via POV c/o neck and chest pain.  Patient states the pain is on the right side of her chest and neck.  Patient states it causes tingling in the right arm.  Patient has dizziness, nausea and shortness of breath.  Patient denies any known cardiac problems.  Patient has even and unlabored respirations at this time and does not present diaphoretic.  Patient has h/o DM.

## 2018-07-23 NOTE — ED Provider Notes (Signed)
 [facility] Emergency Department Provider Note   ____________________________________________   First MD Initiated Contact with Patient 07/23/18 1959     (approximate)  I have reviewed the triage vital signs and the nursing notes.   HISTORY  Chief Complaint Chest Pain and Neck Pain   HPI Gwendolyn Fuller is a 57 y.o. female history diabetes  Patient reports for about 2 weeks now she been expensing increasing pain in the right side of her neck but runs towards her right upper back and is causing tingling and pain down into her right hand.  Today she notes her right hand just felt a little bit weak and she was having some trouble with holding a glass well.   She does report she has a history of neck and lower back pain, but in the last 2 weeks she woke up one morning and felt a popping feeling in the back of her neck and the pain has persisted since that time.  Reports she is having some slight discomfort in the front of her right side of her chest but it really feels more like it is shooting out from her neck like a pinching or sharp feeling with a tingling into the hand more involving the fingers than the thumb.  Still able to use a hand but just feels slightly a little bit weak on the right side.  No trouble speaking.  No confusion.  No abdominal pain no trouble breathing  Past Medical History:  Diagnosis Date  . Arthritis   . Diabetes mellitus without complication (HCC)   . Dyspnea     Patient Active Problem List   Diagnosis Date Noted  . Medication monitoring encounter 01/27/2018  . Chronic lower back pain 01/22/2018  . Screening for colon cancer 03/31/2017  . Vitamin D deficiency 04/22/2016  . Carpal tunnel syndrome, bilateral 04/21/2016  . Uncontrolled type 2 diabetes with neuropathy (HCC) 06/19/2015  . Essential hypertension 06/19/2015  . Hyperlipidemia 06/19/2015  . COLD (chronic obstructive lung disease) (HCC) 06/19/2015  . Tobacco abuse  06/19/2015    Past Surgical History:  Procedure Laterality Date  . CARPAL TUNNEL RELEASE Bilateral   . COLONOSCOPY WITH PROPOFOL N/A 05/22/2017   Procedure: COLONOSCOPY WITH PROPOFOL;  Surgeon: Anna, Kiran, MD;  Location: [facility] ENDOSCOPY;  Service: Endoscopy;  Laterality: N/A;    Prior to Admission medications   Medication Sig Start Date End Date Taking? Authorizing Provider  ACCU-CHEK FASTCLIX LANCETS MISC USE UP TO 3 TIMES A DAY AS DIRECTED 07/01/18   Poulose, Elizabeth E, NP  ACCU-CHEK GUIDE test strip USE UP TO 3 TIMES A DAY AS DIRECTED 07/01/18   Poulose, Elizabeth E, NP  albuterol (PROAIR HFA) 108 (90 Base) MCG/ACT inhaler USE 2 PUFFS FOUR TIMES DAILY AS NEEDED 06/17/18   Lada, Melinda P, MD  blood glucose meter kit and supplies KIT Dispense based on patient and insurance preference. Use up to three times daily as directed; E11.65; LON 99 months 05/07/18   Lada, Melinda P, MD  budesonide-formoterol (SYMBICORT) 160-4.5 MCG/ACT inhaler Inhale 2 puffs into the lungs 2 (two) times daily. 03/03/18   Lada, Melinda P, MD  Elastic Bandages & Supports (LUMBAR BACK BRACE/SUPPORT PAD) MISC Back brace of choice; LON 99 months; Dx chronic low back pain 01/22/18   Lada, Melinda P, MD  gabapentin (NEURONTIN) 300 MG capsule Take 1 capsule (300 mg total) by mouth 2 (two) times daily. 06/17/18   Lada, Melinda P, MD  glimepiride (AMARYL) 4 MG tablet   TAKE TWO TABLETS BY MOUTH DAILY WITH BREAKFAST 04/28/18   Lada, Satira Anis, MD  Insulin Detemir (LEVEMIR FLEXTOUCH) 100 UNIT/ML Pen Inject 40 Units into the skin daily. Do not use Tyler Aas 07/21/18   Arnetha Courser, MD  Lidocaine HCl 2 % SOLN Use as directed in the mouth or throat.    [provider]  lisinopril (PRINIVIL,ZESTRIL) 10 MG tablet Take 1 tablet (10 mg total) by mouth daily. 10/26/17   Roselee Nova, MD  rosuvastatin (CRESTOR) 20 MG tablet Take 1 tablet (20 mg total) by mouth daily. 05/17/18   Arnetha Courser, MD  sitaGLIPtin-metformin (JANUMET)  50-1000 MG tablet Take 1 tablet by mouth 2 (two) times daily with a meal. 05/17/18   Lada, Satira Anis, MD  tiZANidine (ZANAFLEX) 2 MG tablet Take 1 tablet (2 mg total) by mouth 3 (three) times daily. If needed 06/17/18   Lada, Satira Anis, MD  UNIFINE PENTIPS 32G X 4 MM MISC Use as directed with [place]. 07/01/17   Roselee Nova, MD    Allergies Patient has no known allergies.  Family History  Problem Relation Age of Onset  . Diabetes Mother   . Cancer Mother        colon  . Heart disease Mother   . Hypertension Mother   . Diabetes Father   . Parkinson's disease Father   . Alzheimer's disease Father   . Diabetes Maternal Grandmother   . Diabetes Paternal Grandmother   . Cancer Paternal Grandfather        stamoach?    Social History Social History   Tobacco Use  . Smoking status: Former Smoker    Packs/day: 0.50    Years: 25.00    Pack years: 12.50    Types: Cigarettes  . Smokeless tobacco: Never Used  Substance Use Topics  . Alcohol use: No  . Drug use: No    Review of Systems Constitutional: No fever/chills Eyes: No visual changes. ENT: No sore throat.  See HPI regarding neck pain Cardiovascular: Denies chest pain except for as it radiates from the neck to the right upper chest. Respiratory: Denies shortness of breath. Gastrointestinal: No abdominal pain.   Genitourinary: Negative for dysuria. Musculoskeletal: Negative for back pain. Skin: Negative for rash. Neurological: Negative for headaches, areas of focal weakness or numbness. Denies any falls or injuries   ____________________________________________   PHYSICAL EXAM:  VITAL SIGNS: ED Triage Vitals  Enc Vitals Group     BP 07/23/18 1850 (!) 152/83     Pulse Rate 07/23/18 1850 (!) 108     Resp 07/23/18 1850 20     Temp 07/23/18 1850 97.9 F (36.6 C)     Temp Source 07/23/18 1850 Oral     SpO2 07/23/18 1850 96 %     Weight 07/23/18 1851 143 lb (64.9 kg)     Height 07/23/18 1851 5' 2" (1.575 m)      Head Circumference --      Peak Flow --      Pain Score --      Pain Loc --      Pain Edu? --      Excl. in [place]? --     Constitutional: Alert and oriented. Well appearing and in no acute distress but when she does move or turns her neck she winces in pain. Eyes: Conjunctivae are normal. Head: Atraumatic. Nose: No congestion/rhinnorhea. Mouth/Throat: Mucous membranes are moist. Neck: No stridor.  Patient reports tenderness to palpation  of the mid to lower cervical spine.  Additionally reports when palpated on it causes a shooting pain radiating across the right shoulder down towards the fingers her right hand the same distribution as pain. Cardiovascular: Normal rate, regular rhythm. Grossly normal heart sounds.  Good peripheral circulation. Respiratory: Normal respiratory effort.  No retractions. Lungs CTAB. Gastrointestinal: Soft and nontender. No distention. Musculoskeletal: No lower extremity tenderness nor edema.  RIGHT Right upper extremity demonstrates normal strength, good use of all muscles without any notable weakness. No edema bruising or contusions of the right shoulder/upper arm, right elbow, right forearm / hand. Full range of motion of the right right upper extremity but movement especially that of the neck causes pain in the right upper extremity. No evidence of trauma. Strong radial pulse. Intact median/ulnar/radial neuro-muscular exam except she does report slightly decreased sensation over the right hand and right upper arm but still able to distinguish light versus sharp touch well.  LEFT Left upper extremity demonstrates normal strength, good use of all muscles. No edema bruising or contusions of the left shoulder/upper arm, left elbow, left forearm / hand. Full range of motion of the left  upper extremity without pain. No evidence of trauma. Strong radial pulse. Intact median/ulnar/radial neuro-muscular exam.   Neurologic:  Normal speech and language. No gross focal  neurologic deficits are appreciated.  Skin:  Skin is warm, dry and intact. No rash noted. Psychiatric: Mood and affect are normal. Speech and behavior are normal.  ____________________________________________   LABS (all labs ordered are listed, but only abnormal results are displayed)  Labs Reviewed  BASIC METABOLIC PANEL - Abnormal; Notable for the following components:      Result Value   Glucose, Bld 200 (*)    All other components within normal limits  CBC - Abnormal; Notable for the following components:   WBC 12.3 (*)    All other components within normal limits  GLUCOSE, CAPILLARY - Abnormal; Notable for the following components:   Glucose-Capillary 196 (*)    All other components within normal limits  TROPONIN I  POC URINE PREG, ED   ____________________________________________  EKG  I reviewed and interpreted by me at 1900 Heart rate 100 QRS 80 QTc 450 Sinus tachycardia without ischemia ____________________________________________  RADIOLOGY  Dg Chest 2 View  Result Date: 07/23/2018 CLINICAL DATA:  Chest/neck pain EXAM: CHEST - 2 VIEW COMPARISON:  01/08/2015 FINDINGS: Lungs are clear.  No pleural effusion or pneumothorax. The heart is normal in size. Visualized osseous structures are within normal limits. IMPRESSION: Normal chest radiographs. Electronically Signed   By: Julian Hy M.D.   On: 07/23/2018 19:37    MRI pending at signout ____________________________________________   PROCEDURES  Procedure(s) performed: None  Procedures  Critical Care performed: No  ____________________________________________   INITIAL IMPRESSION / ASSESSMENT AND PLAN / ED COURSE  Pertinent labs & imaging results that were available during my care of the patient were reviewed by me and considered in my medical decision making (see chart for details).   Patient presents for evaluation of right-sided neck chest pain.  Appears to have started about a week to 2  weeks ago and the patient was in bed and felt a pinch pop occurring in the right side of her neck.  Clinical examination her symptoms seem to suggest radicular component of discomfort and I suspect this is likely related to cervical etiology.  EKG reassuring, normal troponin symptoms very atypical of coronary syndrome and I doubt ACS as an etiology.  Chest x-ray clear without pulmonary symptoms.  She does have paresthesias involving the right hand no objective but reports a slight subjective weakness as well.  Will obtain MRI of the brain and cervical spine to further evaluate.  No thoracic or lumbar symptoms.  Neurologic exam very reassuring without any hard deficits but does indeed have some slight decrease in sensation subjectively over the right hand.  Ongoing care assigned to Dr. Kerman Passey, follow-up on MRI of the brain and cervical spine.      ____________________________________________   FINAL CLINICAL IMPRESSION(S) / ED DIAGNOSES  Final diagnoses:  Cervical radiculopathy  Paresthesia        Note:  This document was prepared using Dragon voice recognition software and may include unintentional dictation errors       Delman Kitten, MD 07/24/18 0007

## 2018-07-24 LAB — GLUCOSE, CAPILLARY: Glucose-Capillary: 114 mg/dL — ABNORMAL HIGH (ref 70–99)

## 2018-07-24 MED ORDER — HYDROCODONE-ACETAMINOPHEN 5-325 MG PO TABS
1.0000 | ORAL_TABLET | Freq: Once | ORAL | Status: AC
  Administered 2018-07-24: 1 via ORAL
  Filled 2018-07-24: qty 1

## 2018-07-24 MED ORDER — PREDNISONE 20 MG PO TABS: 40.0000 mg | ORAL_TABLET | Freq: Every day | ORAL | 0 refills | Status: DC

## 2018-07-24 MED ORDER — HYDROCODONE-ACETAMINOPHEN 5-325 MG PO TABS: 1.0000 | ORAL_TABLET | ORAL | 0 refills | Status: DC | PRN

## 2018-07-24 NOTE — ED Notes (Signed)
Call to MRI machine is still not back on line. Update given to patient

## 2018-07-24 NOTE — ED Provider Notes (Signed)
-----------------------------------------   2:14 AM on 07/24/2018 -----------------------------------------  MRI machine remains down due to power outage.  Patient is asking to be discharged home, states she does not want to wait any longer.  States she will follow-up with her doctor to get the MRI as an outpatient.  If the symptoms appear to be ongoing times at least 2 weeks I believe this is reasonable if the patient no longer wishes to wait.  We will discharge with a short course of pain medication and the patient will see her PCP this week.  I did discuss return precautions for any worsening weakness numbness, any confusion slurred speech.  Patient agreeable to plan of care.   Minna Antis, MD 07/24/18 [phone]

## 2018-07-24 NOTE — ED Notes (Signed)
Pt will call MRI later today to check on MRI machine

## 2018-07-26 ENCOUNTER — Telehealth: Payer: Self-pay | Admitting: Family Medicine

## 2018-07-26 NOTE — Telephone Encounter (Signed)
Pt had been admitted to the hospital last weekend for pain in her right side of neck that runs to her right upper back. She was there waiting to get an MRI but the machine was down because of a power outage. Pt requested to go home because it was taking so long to get the MRI. And wants to get it done as outpatient.  Pt was discharged home with pain med and a steroid.  She is calling to have the MRI scheduled as outpatient.. Per hospital d/c summary pt should have seen her pcp by 07/26/18.  She is asking for a call back regarding an order for MRI and make an appointment.

## 2018-07-27 NOTE — Telephone Encounter (Signed)
Does pt need appointment first?

## 2018-07-27 NOTE — Telephone Encounter (Signed)
LVM for pt to call and schedule an appt °

## 2018-07-27 NOTE — Telephone Encounter (Signed)
Yes, ER note says: "patient will see her PCP this week. " Please have her schedule f/u with me or Lanora Manis and we will order test at appt

## 2018-08-06 ENCOUNTER — Other Ambulatory Visit: Payer: Self-pay | Admitting: Nurse Practitioner

## 2018-08-06 DIAGNOSIS — IMO0002 Reserved for concepts with insufficient information to code with codable children: Secondary | ICD-10-CM

## 2018-08-06 DIAGNOSIS — E1140 Type 2 diabetes mellitus with diabetic neuropathy, unspecified: Secondary | ICD-10-CM

## 2018-08-06 DIAGNOSIS — E1165 Type 2 diabetes mellitus with hyperglycemia: Principal | ICD-10-CM

## 2018-08-16 ENCOUNTER — Ambulatory Visit: Payer: Medicaid Other | Admitting: Family Medicine

## 2018-08-23 ENCOUNTER — Other Ambulatory Visit: Payer: Self-pay | Admitting: Family Medicine

## 2018-08-27 ENCOUNTER — Other Ambulatory Visit: Payer: Self-pay

## 2018-08-27 DIAGNOSIS — E1165 Type 2 diabetes mellitus with hyperglycemia: Principal | ICD-10-CM

## 2018-08-27 DIAGNOSIS — E1140 Type 2 diabetes mellitus with diabetic neuropathy, unspecified: Secondary | ICD-10-CM

## 2018-08-27 DIAGNOSIS — IMO0002 Reserved for concepts with insufficient information to code with codable children: Secondary | ICD-10-CM

## 2018-08-27 MED ORDER — GLUCOSE BLOOD VI STRP: ORAL_STRIP | 1 refills | Status: DC

## 2018-08-29 ENCOUNTER — Telehealth: Payer: Self-pay | Admitting: Family Medicine

## 2018-08-29 DIAGNOSIS — E1165 Type 2 diabetes mellitus with hyperglycemia: Principal | ICD-10-CM

## 2018-08-29 DIAGNOSIS — G8929 Other chronic pain: Secondary | ICD-10-CM

## 2018-08-29 DIAGNOSIS — M5442 Lumbago with sciatica, left side: Secondary | ICD-10-CM

## 2018-08-29 DIAGNOSIS — IMO0002 Reserved for concepts with insufficient information to code with codable children: Secondary | ICD-10-CM

## 2018-08-29 DIAGNOSIS — E1140 Type 2 diabetes mellitus with diabetic neuropathy, unspecified: Secondary | ICD-10-CM

## 2018-08-29 NOTE — Telephone Encounter (Signed)
  Lab Results  Component Value Date   HGBA1C 8.4 (H) 05/07/2018   Patient needs an appointment please for uncontrolled diabetes She was due to be seen in October and no showed Thank you

## 2018-08-30 NOTE — Telephone Encounter (Signed)
Pt is going to callback to sch an appt. Pt must get a ride first

## 2018-08-30 NOTE — Telephone Encounter (Signed)
LVM on [phone] and informed pt to schedule an appt. Pt have the option of seeing Lanora Manis if Dr Sherie Don is booked

## 2018-08-31 ENCOUNTER — Ambulatory Visit: Payer: Medicaid Other | Admitting: Nurse Practitioner

## 2018-08-31 ENCOUNTER — Encounter: Payer: Self-pay | Admitting: Nurse Practitioner

## 2018-08-31 VITALS — BP 120/70 | HR 90 | Temp 98.5°F | Resp 16 | Ht 62.0 in | Wt 146.0 lb

## 2018-08-31 DIAGNOSIS — E782 Mixed hyperlipidemia: Secondary | ICD-10-CM

## 2018-08-31 DIAGNOSIS — E559 Vitamin D deficiency, unspecified: Secondary | ICD-10-CM

## 2018-08-31 DIAGNOSIS — Z720 Tobacco use: Secondary | ICD-10-CM | POA: Diagnosis not present

## 2018-08-31 DIAGNOSIS — I10 Essential (primary) hypertension: Secondary | ICD-10-CM | POA: Diagnosis not present

## 2018-08-31 DIAGNOSIS — M25511 Pain in right shoulder: Secondary | ICD-10-CM

## 2018-08-31 DIAGNOSIS — M542 Cervicalgia: Secondary | ICD-10-CM

## 2018-08-31 DIAGNOSIS — E1140 Type 2 diabetes mellitus with diabetic neuropathy, unspecified: Secondary | ICD-10-CM | POA: Diagnosis not present

## 2018-08-31 DIAGNOSIS — Z122 Encounter for screening for malignant neoplasm of respiratory organs: Secondary | ICD-10-CM

## 2018-08-31 DIAGNOSIS — J411 Mucopurulent chronic bronchitis: Secondary | ICD-10-CM

## 2018-08-31 DIAGNOSIS — E1165 Type 2 diabetes mellitus with hyperglycemia: Secondary | ICD-10-CM

## 2018-08-31 DIAGNOSIS — IMO0002 Reserved for concepts with insufficient information to code with codable children: Secondary | ICD-10-CM

## 2018-08-31 DIAGNOSIS — M5412 Radiculopathy, cervical region: Secondary | ICD-10-CM

## 2018-08-31 MED ORDER — ALBUTEROL SULFATE HFA 108 (90 BASE) MCG/ACT IN AERS: INHALATION_SPRAY | RESPIRATORY_TRACT | 2 refills | Status: DC

## 2018-08-31 MED ORDER — ACCU-CHEK FASTCLIX LANCETS MISC: 0 refills | Status: DC

## 2018-08-31 MED ORDER — LISINOPRIL 10 MG PO TABS: 10.0000 mg | ORAL_TABLET | Freq: Every day | ORAL | 0 refills | Status: DC

## 2018-08-31 MED ORDER — GLUCOSE BLOOD VI STRP: ORAL_STRIP | 1 refills | Status: DC

## 2018-08-31 MED ORDER — ROSUVASTATIN CALCIUM 20 MG PO TABS: 20.0000 mg | ORAL_TABLET | Freq: Every day | ORAL | 0 refills | Status: DC

## 2018-08-31 MED ORDER — GLIMEPIRIDE 4 MG PO TABS: ORAL_TABLET | ORAL | 0 refills | Status: DC

## 2018-08-31 MED ORDER — INSULIN DETEMIR 100 UNIT/ML FLEXPEN: 45.0000 [IU] | PEN_INJECTOR | Freq: Every day | SUBCUTANEOUS | 2 refills | Status: DC

## 2018-08-31 MED ORDER — BUDESONIDE-FORMOTEROL FUMARATE 160-4.5 MCG/ACT IN AERO: 2.0000 | INHALATION_SPRAY | Freq: Two times a day (BID) | RESPIRATORY_TRACT | 3 refills | Status: AC

## 2018-08-31 MED ORDER — SITAGLIPTIN PHOS-METFORMIN HCL 50-1000 MG PO TABS: 1.0000 | ORAL_TABLET | Freq: Two times a day (BID) | ORAL | 0 refills | Status: DC

## 2018-08-31 NOTE — Patient Instructions (Signed)
-   Increase levemir to 45 units at night.

## 2018-08-31 NOTE — Progress Notes (Signed)
Name: Gwendolyn Fuller   MRN: [id number]    DOB: [birth year]   Date:08/31/2018       Progress Note  Subjective  Chief Complaint  Chief Complaint  Patient presents with  . Shoulder Pain    right shoulder pain. Has been hospitalized for 3 days.  . Diabetes    HPI  Diabetes Mellitus Patient is prescribed sitagliptin 50 and metformin 1000 mg twice a day, glimepiride 8 mg, and 40 units of Levemir at night  Patient checks blood sugars fasting range 113-325 averages around 200's. One episode of blood sugar over 400- was taking steroids for pain at that time.  Patient endorses polydipsia denies polydipsia polyphagia polyuria Endorses neuropathy on bilateral feet.   Hypertension Patient is prescribed lisinopril 10 mg daily Patient does not checks blood pressures at home  Patient denies severe headaches, blurry vision, chest pain; does note increase in mild headaches- states hasn't been sleeping that much.  Hyperlipidemia Patient is prescribed 20 mg of Crestor nightly. Denies myalgias   Vitamin D deficiency Patient is not taking supplementation; states unable to afford this   Tobacco abuse Patient states she quit smoking 4 months. Is eating a lot of bubble gum. Previously smoked 1 packs per day since she was 64- so approximately 36 years.   Obstructive lung disease Patient is prescribed Symbicort 1 puffs twice daily.  Albuterol inhaler as needed; states last needed last night for coughing Patient denies shortness of breath, wheezing. Endorses cough   Neck and Shoulder pain Went to the ER for right neck and shoulder pain in September was prescribed short course of opiates and muscle relaxer's. Received short course of steroid taper- eased up pain but blood sugars were out of control. States is limiting her sleep. Pain starts at neck shoots down to shoulder and down arm. Patient taking gabapentin for chronic back pain states has helped some with shoulder pain. Has used ice, bengay and tiger  balm with mild relief. States muscles in neck feel very tight. History of herniated cervical disc.   Patient Active Problem List   Diagnosis Date Noted  . Medication monitoring encounter 01/27/2018  . Chronic lower back pain 01/22/2018  . Screening for colon cancer 03/31/2017  . Vitamin D deficiency 04/22/2016  . Carpal tunnel syndrome, bilateral 04/21/2016  . Uncontrolled type 2 diabetes with neuropathy ([place]) 06/19/2015  . Essential hypertension 06/19/2015  . Hyperlipidemia 06/19/2015  . COLD (chronic obstructive lung disease) ([place]) 06/19/2015  . Tobacco abuse 06/19/2015    Past Medical History:  Diagnosis Date  . Arthritis   . Diabetes mellitus without complication ([place])   . Dyspnea     Past Surgical History:  Procedure Laterality Date  . CARPAL TUNNEL RELEASE Bilateral   . COLONOSCOPY WITH PROPOFOL N/A 05/22/2017   Procedure: COLONOSCOPY WITH PROPOFOL;  Surgeon: Jonathon Bellows, MD;  Location: [facility] ENDOSCOPY;  Service: Endoscopy;  Laterality: N/A;    Social History   Tobacco Use  . Smoking status: Former Smoker    Packs/day: 0.50    Years: 25.00    Pack years: 12.50    Types: Cigarettes  . Smokeless tobacco: Never Used  Substance Use Topics  . Alcohol use: No     Current Outpatient Medications:  .  ACCU-CHEK FASTCLIX LANCETS MISC, TEST UP TO 3 TIMES A DAY AS DIRECTED, Disp: 102 each, Rfl: 0 .  albuterol (PROAIR HFA) 108 (90 Base) MCG/ACT inhaler, USE 2 PUFFS FOUR TIMES DAILY AS NEEDED, Disp: 1 Inhaler, Rfl: 2 .  blood glucose meter kit and supplies KIT, Dispense based on patient and insurance preference. Use up to three times daily as directed; E11.65; LON 99 months, Disp: 1 each, Rfl: 0 .  budesonide-formoterol (SYMBICORT) 160-4.5 MCG/ACT inhaler, Inhale 2 puffs into the lungs 2 (two) times daily., Disp: 3 Inhaler, Rfl: 3 .  Elastic Bandages & Supports (LUMBAR BACK BRACE/SUPPORT PAD) MISC, Back brace of choice; LON 99 months; Dx chronic low back pain, Disp: 1 each,  Rfl: 0 .  gabapentin (NEURONTIN) 300 MG capsule, Take 1 capsule (300 mg total) by mouth 2 (two) times daily., Disp: 180 capsule, Rfl: 1 .  glimepiride (AMARYL) 4 MG tablet, TAKE 2 TABLETS BY MOUTH EVERY DAY WITH BREAKFAST, Disp: 180 tablet, Rfl: 0 .  glucose blood (ACCU-CHEK GUIDE) test strip, USE UP TO 3 TIMES A DAY AS DIRECTED, Disp: 100 each, Rfl: 1 .  Insulin Detemir (LEVEMIR FLEXTOUCH) 100 UNIT/ML Pen, Inject 45 Units into the skin at bedtime., Disp: 1 pen, Rfl: 2 .  Lidocaine HCl 2 % SOLN, Use as directed in the mouth or throat., Disp: , Rfl:  .  lisinopril (PRINIVIL,ZESTRIL) 10 MG tablet, Take 1 tablet (10 mg total) by mouth daily., Disp: 90 tablet, Rfl: 0 .  rosuvastatin (CRESTOR) 20 MG tablet, Take 1 tablet (20 mg total) by mouth daily., Disp: 90 tablet, Rfl: 0 .  sitaGLIPtin-metformin (JANUMET) 50-1000 MG tablet, Take 1 tablet by mouth 2 (two) times daily with a meal., Disp: 180 tablet, Rfl: 0 .  tiZANidine (ZANAFLEX) 2 MG tablet, Take 1 tablet (2 mg total) by mouth 3 (three) times daily. If needed, Disp: 60 tablet, Rfl: 1 .  UNIFINE PENTIPS 32G X 4 MM MISC, Use as directed with Tyler Aas., Disp: 200 each, Rfl: 2  No Known Allergies  ROS   No other specific complaints in a complete review of systems (except as listed in HPI above).  Objective  Vitals:   08/31/18 0851 08/31/18 0853  BP: (!) 150/80 120/70  Pulse: 90   Resp: 16   Temp: 98.5 F (36.9 C)   TempSrc: Oral   SpO2: 98%   Weight: 146 lb (66.2 kg)   Height: _0  (1.575 m)      Body mass index is 26.7 kg/m.  Nursing Note and Vital Signs reviewed.  Physical Exam  Constitutional: Patient appears well-developed and well-nourished. No distress.  HEENT: head atraumatic, normocephalic, pupils equal and reactive to light, neck supple, throat within normal limits Cardiovascular: Normal rate, regular rhythm and normal heart sounds.   Pulmonary/Chest: Effort normal and breath sounds normal. No respiratory  distress. MSK: neck tenderness, right shoulder decreased range of motion due to pain.  Abdominal: Soft. There is no tenderness. Psychiatric: Patient has a normal mood and affect. behavior is normal. Judgment and thought content normal.    No results found for this or any previous visit (from the past 48 hour(s)).  Assessment & Plan  1. Uncontrolled type 2 diabetes with neuropathy [facility]) Discussed importance for more frequent appointments in the beginning to better manage this; increasing levemir to 45 units lightly.  - Ambulatory referral to Chronic Care Management Services - ACCU-CHEK FASTCLIX LANCETS MISC; TEST UP TO 3 TIMES A DAY AS DIRECTED  Dispense: 102 each; Refill: 0 - glimepiride (AMARYL) 4 MG tablet; TAKE 2 TABLETS BY MOUTH EVERY DAY WITH BREAKFAST  Dispense: 180 tablet; Refill: 0 - glucose blood (ACCU-CHEK GUIDE) test strip; USE UP TO 3 TIMES A DAY AS DIRECTED  Dispense: 100 each; Refill:  1 - sitaGLIPtin-metformin (JANUMET) 50-1000 MG tablet; Take 1 tablet by mouth 2 (two) times daily with a meal.  Dispense: 180 tablet; Refill: 0 - HgB A1c  2. Essential hypertension stable - Ambulatory referral to Chronic Care Management Services - lisinopril (PRINIVIL,ZESTRIL) 10 MG tablet; Take 1 tablet (10 mg total) by mouth daily.  Dispense: 90 tablet; Refill: 0  3. Mixed hyperlipidemia recehck - Ambulatory referral to Chronic Care Management Services - Lipid Profile  4. Tobacco abuse She quit 3 months ago- no longer smoking  - Ambulatory referral to Chronic Care Management Services  5. Vitamin D deficiency Not taking supplements right now  - Vitamin D (25 hydroxy)  6. Mucopurulent chronic bronchitis (HCC) Was taking 1 puff BID; albuterol PRN  - albuterol (PROAIR HFA) 108 (90 Base) MCG/ACT inhaler; USE 2 PUFFS FOUR TIMES DAILY AS NEEDED  Dispense: 1 Inhaler; Refill: 2 - budesonide-formoterol (SYMBICORT) 160-4.5 MCG/ACT inhaler; Inhale 2 puffs into the lungs 2 (two) times daily.   Dispense: 3 Inhaler; Refill: 3  7. Encounter for screening for malignant neoplasm of respiratory organs - CT CHEST LUNG CA SCREEN LOW DOSE W/O CM; Future  8. Acute pain of right shoulder Pain severe but has been ongoing since September- patient is taking gabapentin, steroids provided short-term relief but with uncontrolled diabetes do not recommend longer taper at this time. Considered prescription strength NSAIDs for short-term relief. Patient requested arm sling and back brace.  - Ambulatory referral to Orthopedic Surgery - MR Cervical Spine Wo Contrast; Future  9. Cervical radiculopathy - Ambulatory referral to Orthopedic Surgery - MR Cervical Spine Wo Contrast; Future  10. Neck pain - Ambulatory referral to Orthopedic Surgery - MR Cervical Spine Wo Contrast; Future

## 2018-09-01 ENCOUNTER — Other Ambulatory Visit: Payer: Self-pay | Admitting: Family Medicine

## 2018-09-01 ENCOUNTER — Telehealth: Payer: Self-pay | Admitting: *Deleted

## 2018-09-01 DIAGNOSIS — IMO0002 Reserved for concepts with insufficient information to code with codable children: Secondary | ICD-10-CM

## 2018-09-01 DIAGNOSIS — E1165 Type 2 diabetes mellitus with hyperglycemia: Principal | ICD-10-CM

## 2018-09-01 DIAGNOSIS — E1140 Type 2 diabetes mellitus with diabetic neuropathy, unspecified: Secondary | ICD-10-CM

## 2018-09-01 NOTE — Telephone Encounter (Signed)
Received referral for low dose lung cancer screening CT scan.  Message left at phone number listed in EMR for patient to call either myself or Shawn Perkins back at [phone] to facilitate scheduling the scan.    

## 2018-09-03 ENCOUNTER — Ambulatory Visit: Payer: Self-pay | Admitting: Pharmacist

## 2018-09-03 NOTE — Chronic Care Management (AMB) (Signed)
  Care Management   Note  09/03/2018 Name: Gwendolyn Fuller MRN: [id number] DOB: 08/12/1961   57 y.o. year old female referred to Care Management by Rolm Bookbinder for chronic disease management for DM and HLD.   Was unable to reach patient via telephone today and have left HIPAA compliant voicemail asking patient to return my call. (unsuccessful outreach #1).  Plan: Will follow-up within 3-5  business days via telephone.   Karalee Height, PharmD Clinical Pharmacist [facility] [phone]

## 2018-09-07 ENCOUNTER — Ambulatory Visit: Payer: Self-pay | Admitting: Pharmacist

## 2018-09-07 NOTE — Chronic Care Management (AMB) (Signed)
  Care Management   Note  09/07/2018 Name: Gwendolyn StackMaritza Fuller MRN: [id number] DOB: December 30, 1960   57 y.o. year old female referred to Chronic Care Management by Rolm BookbinderElizabeth Poulouse, NP for chronic disease management related to DM and HLD.   Was unable to reach patient via telephone today and have left HIPAA compliant voicemail asking patient to return my call. (unsuccessful outreach #2).  Plan: Will follow-up within 3-5  business days via telephone.   Karalee HeightJulie Avante Carneiro, PharmD Clinical Pharmacist [facility] [phone]

## 2018-09-08 ENCOUNTER — Telehealth: Payer: Self-pay | Admitting: *Deleted

## 2018-09-08 NOTE — Telephone Encounter (Signed)
Received referral for low dose lung cancer screening CT scan. Message left at phone number listed in EMR for patient to call me back to facilitate scheduling scan.  

## 2018-09-09 ENCOUNTER — Ambulatory Visit: Payer: Self-pay | Admitting: Pharmacist

## 2018-09-09 ENCOUNTER — Telehealth: Payer: Self-pay

## 2018-09-09 NOTE — Chronic Care Management (AMB) (Signed)
  Care Management   Note  09/09/2018 Name: Gwendolyn StackMaritza Dolder MRN: [id number] DOB: 1961-07-02  57 y.o. year old female referred to Care Management team for disease management for HLD, DM by Rolm BookbinderElizabeth Poulouse, NP.   Care Management team services are being closed due to three unsuccessful outreach attempts.   Patient has been provided CCM contact information if he/she wishes to engage with care managers in the future.   Karalee HeightJulie Dot Splinter, PharmD Clinical Pharmacist [facility] [phone]

## 2018-09-14 ENCOUNTER — Ambulatory Visit: Payer: Medicaid Other | Admitting: Nurse Practitioner

## 2018-09-15 ENCOUNTER — Telehealth: Payer: Self-pay | Admitting: *Deleted

## 2018-09-15 NOTE — Telephone Encounter (Signed)
Received referral for low dose lung cancer screening CT scan. Message left at phone number listed in EMR for patient to call me back to facilitate scheduling scan.  

## 2018-09-16 ENCOUNTER — Ambulatory Visit: Payer: Self-pay | Admitting: Pharmacist

## 2018-09-16 ENCOUNTER — Telehealth: Payer: Self-pay | Admitting: *Deleted

## 2018-09-16 DIAGNOSIS — Z122 Encounter for screening for malignant neoplasm of respiratory organs: Secondary | ICD-10-CM

## 2018-09-16 DIAGNOSIS — Z87891 Personal history of nicotine dependence: Secondary | ICD-10-CM

## 2018-09-16 NOTE — Telephone Encounter (Signed)
Received referral for initial lung cancer screening scan. Contacted patient and obtained smoking history,(former, quit 06/2011, 37 pack year) as well as answering questions related to screening process. Patient denies signs of lung cancer such as weight loss or hemoptysis. Patient denies comorbidity that would prevent curative treatment if lung cancer were found. Patient is scheduled for shared decision making visit and CT scan on 10/04/18 at 845am.

## 2018-09-16 NOTE — Chronic Care Management (AMB) (Signed)
  Care Management   Note  09/16/2018 Name: Gwendolyn StackMaritza Laurel MRN: [id number] DOB: 04-27-1961   57 y.o. year old female referred to Care Management team for disease management for HLD, DM by Rolm BookbinderElizabeth Poulouse, NP.   [name] returned my call from last week, stating she had been without a phone for several weeks.   I introduced care management services. [name] politely declined.   I provided care management team contact information should have any needs in the future.   Karalee HeightJulie Dammon Makarewicz, PharmD Clinical Pharmacist [facility] [phone]

## 2018-09-17 ENCOUNTER — Ambulatory Visit
Admission: RE | Admit: 2018-09-17 | Discharge: 2018-09-17 | Disposition: A | Discharge status: Home / Self Care | Payer: Medicaid Other | Source: Ambulatory Visit | Attending: Nurse Practitioner | Admitting: Nurse Practitioner

## 2018-09-17 DIAGNOSIS — M542 Cervicalgia: Secondary | ICD-10-CM | POA: Insufficient documentation

## 2018-09-17 DIAGNOSIS — M5412 Radiculopathy, cervical region: Secondary | ICD-10-CM

## 2018-09-17 DIAGNOSIS — M4722 Other spondylosis with radiculopathy, cervical region: Secondary | ICD-10-CM | POA: Insufficient documentation

## 2018-09-17 DIAGNOSIS — M25511 Pain in right shoulder: Secondary | ICD-10-CM

## 2018-09-20 ENCOUNTER — Telehealth: Payer: Self-pay | Admitting: Family Medicine

## 2018-09-20 NOTE — Telephone Encounter (Unsigned)
Copied from CRM [phone]. Topic: Quick Communication - Other Results (Clinic Use ONLY) >> Sep 20, 2018  1:49 PM Tommie RaymondBooker, Crystal L, CMA wrote: Patient called.  Unable to reach patient. If patient calls back please inform her of her most recent MRI results. Results will be mailed as well.

## 2018-09-21 NOTE — Telephone Encounter (Signed)
Attempted to call pt but no answer at this time.

## 2018-09-27 ENCOUNTER — Ambulatory Visit: Payer: Medicaid Other | Admitting: Nurse Practitioner

## 2018-09-28 ENCOUNTER — Other Ambulatory Visit: Payer: Self-pay | Admitting: Family Medicine

## 2018-09-28 ENCOUNTER — Other Ambulatory Visit: Payer: Self-pay | Admitting: Nurse Practitioner

## 2018-09-28 DIAGNOSIS — G8929 Other chronic pain: Secondary | ICD-10-CM

## 2018-09-28 DIAGNOSIS — M5442 Lumbago with sciatica, left side: Principal | ICD-10-CM

## 2018-10-04 ENCOUNTER — Encounter: Payer: Self-pay | Admitting: Oncology

## 2018-10-04 ENCOUNTER — Ambulatory Visit
Admission: RE | Admit: 2018-10-04 | Discharge: 2018-10-04 | Disposition: A | Discharge status: Home / Self Care | Payer: Medicaid Other | Source: Ambulatory Visit | Attending: Oncology | Admitting: Oncology

## 2018-10-04 ENCOUNTER — Inpatient Hospital Stay: Payer: Medicaid Other | Attending: Oncology | Admitting: Nurse Practitioner

## 2018-10-04 DIAGNOSIS — Z87891 Personal history of nicotine dependence: Secondary | ICD-10-CM | POA: Insufficient documentation

## 2018-10-04 DIAGNOSIS — Z122 Encounter for screening for malignant neoplasm of respiratory organs: Secondary | ICD-10-CM

## 2018-10-04 NOTE — Progress Notes (Signed)
In accordance with CMS guidelines, patient has met eligibility criteria including age, absence of signs or symptoms of lung cancer.  Social History   Tobacco Use  . Smoking status: Former Smoker    Packs/day: 1.00    Years: 37.00    Pack years: 37.00    Types: Cigarettes    Last attempt to quit: 06/27/2018    Years since quitting: 0.2  . Smokeless tobacco: Never Used  Substance Use Topics  . Alcohol use: No  . Drug use: No      A shared decision-making session was conducted prior to the performance of CT scan. This includes one or more decision aids, includes benefits and harms of screening, follow-up diagnostic testing, over-diagnosis, false positive rate, and total radiation exposure.   Counseling on the importance of adherence to annual lung cancer LDCT screening, impact of co-morbidities, and ability or willingness to undergo diagnosis and treatment is imperative for compliance of the program.   Counseling on the importance of continued smoking cessation for former smokers; the importance of smoking cessation for current smokers, and information about tobacco cessation interventions have been given to patient including [place] and [place] quit [place] programs.   Written order for lung cancer screening with LDCT has been given to the patient and any and all questions have been answered to the best of my abilities.    Yearly follow up will be coordinated by Burgess Estelle, Thoracic Navigator.  Beckey Rutter, DNP, AGNP-C [place] at [facility] [phone] (work cell) [phone] (office) 10/04/18 10:23 AM

## 2018-10-06 ENCOUNTER — Other Ambulatory Visit: Payer: Self-pay | Admitting: Nurse Practitioner

## 2018-10-06 ENCOUNTER — Telehealth: Payer: Self-pay | Admitting: Nurse Practitioner

## 2018-10-06 MED ORDER — MELOXICAM 7.5 MG PO TABS: 7.5000 mg | ORAL_TABLET | Freq: Every day | ORAL | 0 refills | Status: DC

## 2018-10-06 NOTE — Telephone Encounter (Signed)
Copied from CRM [id number]. Topic: General - Other >> Oct 06, 2018  9:45 AM Lynne LoganHudson, Caryn D wrote: Reason for CRM:  Pt stated she received her lab results in the mail and there was a message from Sharyon CableElizabeth Poulose NP asking if she was feeling better and if not she would send a short term dose of Meloxicam to pt's pharmacy. Pt would like to have the Meloxicam. Please advise Cb# [phone]   [facility] [address] [facility] [place], [place] [address] [phone] (Phone) [phone] (Fax)

## 2018-10-06 NOTE — Telephone Encounter (Signed)
See note

## 2018-10-06 NOTE — Telephone Encounter (Signed)
[facility]

## 2018-10-08 ENCOUNTER — Encounter: Payer: Self-pay | Admitting: *Deleted

## 2018-10-11 ENCOUNTER — Ambulatory Visit: Payer: Medicaid Other | Admitting: Family Medicine

## 2018-10-28 ENCOUNTER — Other Ambulatory Visit: Payer: Self-pay | Admitting: Nurse Practitioner

## 2018-10-28 DIAGNOSIS — IMO0002 Reserved for concepts with insufficient information to code with codable children: Secondary | ICD-10-CM

## 2018-10-28 DIAGNOSIS — E1140 Type 2 diabetes mellitus with diabetic neuropathy, unspecified: Secondary | ICD-10-CM

## 2018-10-28 DIAGNOSIS — E1165 Type 2 diabetes mellitus with hyperglycemia: Principal | ICD-10-CM

## 2018-10-29 ENCOUNTER — Other Ambulatory Visit: Payer: Self-pay | Admitting: Nurse Practitioner

## 2018-10-29 DIAGNOSIS — E1140 Type 2 diabetes mellitus with diabetic neuropathy, unspecified: Secondary | ICD-10-CM

## 2018-10-29 DIAGNOSIS — E1165 Type 2 diabetes mellitus with hyperglycemia: Principal | ICD-10-CM

## 2018-10-29 DIAGNOSIS — IMO0002 Reserved for concepts with insufficient information to code with codable children: Secondary | ICD-10-CM

## 2018-11-09 ENCOUNTER — Encounter: Payer: Self-pay | Admitting: Family Medicine

## 2018-11-09 ENCOUNTER — Ambulatory Visit: Payer: Medicaid Other | Admitting: Family Medicine

## 2018-11-09 VITALS — BP 118/66 | HR 94 | Temp 97.5°F | Ht 62.0 in | Wt 146.7 lb

## 2018-11-09 DIAGNOSIS — M5412 Radiculopathy, cervical region: Secondary | ICD-10-CM | POA: Insufficient documentation

## 2018-11-09 DIAGNOSIS — Z23 Encounter for immunization: Secondary | ICD-10-CM | POA: Diagnosis not present

## 2018-11-09 DIAGNOSIS — IMO0002 Reserved for concepts with insufficient information to code with codable children: Secondary | ICD-10-CM

## 2018-11-09 DIAGNOSIS — E1140 Type 2 diabetes mellitus with diabetic neuropathy, unspecified: Secondary | ICD-10-CM

## 2018-11-09 DIAGNOSIS — M79672 Pain in left foot: Secondary | ICD-10-CM

## 2018-11-09 DIAGNOSIS — Z87891 Personal history of nicotine dependence: Secondary | ICD-10-CM

## 2018-11-09 DIAGNOSIS — M25511 Pain in right shoulder: Secondary | ICD-10-CM

## 2018-11-09 DIAGNOSIS — E1165 Type 2 diabetes mellitus with hyperglycemia: Secondary | ICD-10-CM

## 2018-11-09 DIAGNOSIS — E782 Mixed hyperlipidemia: Secondary | ICD-10-CM

## 2018-11-09 DIAGNOSIS — I10 Essential (primary) hypertension: Secondary | ICD-10-CM

## 2018-11-09 DIAGNOSIS — Z5181 Encounter for therapeutic drug level monitoring: Secondary | ICD-10-CM

## 2018-11-09 DIAGNOSIS — E559 Vitamin D deficiency, unspecified: Secondary | ICD-10-CM

## 2018-11-09 MED ORDER — MELOXICAM 7.5 MG PO TABS: 7.5000 mg | ORAL_TABLET | Freq: Two times a day (BID) | ORAL | 0 refills | Status: DC | PRN

## 2018-11-09 NOTE — Patient Instructions (Signed)
We'll get labs today We'll have you see the physical therapist and the spine specialist If you have not heard anything from my staff in a week about any orders/referrals/studies from today, please contact us here to follow-up [phone] Please do see your eye doctor regularly, and have your eyes examined every year (or more often per his or her recommendation) Check your feet every night and let me know right away of any sores, infections, numbness, etc. Try to limit sweets, white bread, white rice, white potatoes, and sugary drinks

## 2018-11-09 NOTE — Assessment & Plan Note (Signed)
Controlled; continue same regimen; try to limit salt

## 2018-11-09 NOTE — Assessment & Plan Note (Signed)
Reviewed MRI; will refer to Loralyn Freshwater, PT and orthopaedic spine specialist; refilled the meloxicam to be taken BID PRN with food

## 2018-11-09 NOTE — Assessment & Plan Note (Signed)
Foot exam by MD; encouraged her to limit soda; check labs today

## 2018-11-09 NOTE — Assessment & Plan Note (Signed)
Check level and supplement as needed 

## 2018-11-09 NOTE — Assessment & Plan Note (Signed)
Limit foods from cow and pig; check lipids today

## 2018-11-09 NOTE — Assessment & Plan Note (Signed)
Check kidneys, liver

## 2018-11-09 NOTE — Assessment & Plan Note (Signed)
So proud of patient for quitting smoking
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
## 2018-11-10 ENCOUNTER — Other Ambulatory Visit: Payer: Self-pay | Admitting: Family Medicine

## 2018-11-10 MED ORDER — ROSUVASTATIN CALCIUM 40 MG PO TABS: 40.0000 mg | ORAL_TABLET | Freq: Every day | ORAL | 3 refills | Status: AC

## 2018-11-10 MED ORDER — ICOSAPENT ETHYL 1 G PO CAPS: ORAL_CAPSULE | ORAL | 11 refills | Status: AC

## 2018-11-10 MED ORDER — VITAMIN D (ERGOCALCIFEROL) 1.25 MG (50000 UNIT) PO CAPS: 50000.0000 [IU] | ORAL_CAPSULE | ORAL | 0 refills | Status: DC

## 2018-11-10 MED ORDER — INSULIN DETEMIR 100 UNIT/ML FLEXPEN: 45.0000 [IU] | PEN_INJECTOR | Freq: Every day | SUBCUTANEOUS | 0 refills | Status: DC

## 2018-11-10 NOTE — Progress Notes (Signed)
Add vascepa Increase crestor Patient needs to see endo ASAP Start vit D Rx Increase insulin from 40 to 45 units nightly (step up) Add on path smear reviewed Recheck CBC in one month

## 2018-11-10 NOTE — Progress Notes (Signed)
Pt returned call / please give pt a call and advise

## 2018-11-10 NOTE — Progress Notes (Signed)
Gwendolyn Fuller, please let the patient know that her vitamin D is very very low. I'm sending in some prescription vitamin D to help bring this up. She'll take it once a week for 12 weeks. We'll recheck it at next visit in three months.  Her cholesterol is really abnormal. Increase Crestor from 20 mg to 40 mg daily and add Vascepa (get her savings card please). Really really watch diet. Her A1c is out of control. Please have her see endocrinologist ASAP. Increase insulin from 40 to 43 units daily for 3 days, then go to 45 units daily. Her CBC is abnormal. This may be from her smoking hx, so we're so glad she stopped. Please ADD ON a path smear review for leukocytosis. Recheck in one month, please ORDER CBC with diff for leukocytosis.

## 2018-11-11 ENCOUNTER — Other Ambulatory Visit: Payer: Self-pay

## 2018-11-11 DIAGNOSIS — D72829 Elevated white blood cell count, unspecified: Secondary | ICD-10-CM

## 2018-11-12 ENCOUNTER — Encounter: Payer: Self-pay | Admitting: Family Medicine

## 2018-11-12 DIAGNOSIS — R718 Other abnormality of red blood cells: Secondary | ICD-10-CM | POA: Insufficient documentation

## 2018-11-12 DIAGNOSIS — D72829 Elevated white blood cell count, unspecified: Secondary | ICD-10-CM | POA: Insufficient documentation

## 2018-11-12 DIAGNOSIS — D751 Secondary polycythemia: Secondary | ICD-10-CM | POA: Insufficient documentation

## 2018-11-12 LAB — CBC WITH DIFFERENTIAL/PLATELET
Absolute Monocytes: 702 cells/uL (ref 200–950)
Basophils Absolute: 35 cells/uL (ref 0–200)
Basophils Relative: 0.3 %
Eosinophils Absolute: 127 cells/uL (ref 15–500)
Eosinophils Relative: 1.1 %
HCT: 45.1 % — ABNORMAL HIGH (ref 35.0–45.0)
Hemoglobin: 15.3 g/dL (ref 11.7–15.5)
Lymphs Abs: 3220 cells/uL (ref 850–3900)
MCH: 29.4 pg (ref 27.0–33.0)
MCHC: 33.9 g/dL (ref 32.0–36.0)
MCV: 86.6 fL (ref 80.0–100.0)
MPV: 11.1 fL (ref 7.5–12.5)
Monocytes Relative: 6.1 %
Neutro Abs: 7418 cells/uL (ref 1500–7800)
Neutrophils Relative %: 64.5 %
Platelets: 303 10*3/uL (ref 140–400)
RBC: 5.21 10*6/uL — AB (ref 3.80–5.10)
RDW: 13.8 % (ref 11.0–15.0)
Total Lymphocyte: 28 %
WBC: 11.5 10*3/uL — AB (ref 3.8–10.8)

## 2018-11-12 LAB — COMPLETE METABOLIC PANEL WITH GFR
AG Ratio: 1.2 (calc) (ref 1.0–2.5)
ALT: 25 U/L (ref 6–29)
AST: 15 U/L (ref 10–35)
Albumin: 4.1 g/dL (ref 3.6–5.1)
Alkaline phosphatase (APISO): 113 U/L (ref 33–130)
BUN: 16 mg/dL (ref 7–25)
CO2: 22 mmol/L (ref 20–32)
Calcium: 10 mg/dL (ref 8.6–10.4)
Chloride: 102 mmol/L (ref 98–110)
Creat: 0.75 mg/dL (ref 0.50–1.05)
GFR, Est African American: 101 mL/min/{1.73_m2} (ref >=60)
GFR, Est Non African American: 87 mL/min/{1.73_m2} (ref >=60)
GLUCOSE: 252 mg/dL — AB (ref 65–99)
Globulin: 3.3 g/dL (calc) (ref 1.9–3.7)
Potassium: 4.4 mmol/L (ref 3.5–5.3)
Sodium: 136 mmol/L (ref 135–146)
Total Bilirubin: 0.4 mg/dL (ref 0.2–1.2)
Total Protein: 7.4 g/dL (ref 6.1–8.1)

## 2018-11-12 LAB — LIPID PANEL
CHOL/HDL RATIO: 7.2 (calc) — AB (ref <5.0)
Cholesterol: 280 mg/dL — ABNORMAL HIGH (ref <200)
HDL: 39 mg/dL — ABNORMAL LOW (ref >50)
Non-HDL Cholesterol (Calc): 241 mg/dL (calc) — ABNORMAL HIGH (ref <130)
Triglycerides: 600 mg/dL — ABNORMAL HIGH (ref <150)

## 2018-11-12 LAB — PATHOLOGIST SMEAR REVIEW

## 2018-11-12 LAB — VITAMIN D 25 HYDROXY (VIT D DEFICIENCY, FRACTURES): Vit D, 25-Hydroxy: 8 ng/mL — ABNORMAL LOW (ref 30–100)

## 2018-11-12 LAB — TEST AUTHORIZATION

## 2018-11-12 LAB — HEMOGLOBIN A1C
Hgb A1c MFr Bld: 10.1 % of total Hgb — ABNORMAL HIGH (ref <5.7)
MEAN PLASMA GLUCOSE: 243 (calc)
eAG (mmol/L): 13.5 (calc)

## 2018-11-12 NOTE — Progress Notes (Signed)
Gwendolyn Fuller, please let the patient know that her white blood cell count is a little high; her red blood cell count is a little; under the microscope, she had a few abnormalities, so we want her to see a hematologist; please REFER, dx: leukocytosis, erythrocytosis, and polychromasia (in problem list now)

## 2018-11-15 ENCOUNTER — Telehealth: Payer: Self-pay

## 2018-11-15 DIAGNOSIS — D751 Secondary polycythemia: Secondary | ICD-10-CM

## 2018-11-15 DIAGNOSIS — R718 Other abnormality of red blood cells: Secondary | ICD-10-CM

## 2018-11-15 DIAGNOSIS — D72829 Elevated white blood cell count, unspecified: Secondary | ICD-10-CM

## 2018-11-21 ENCOUNTER — Other Ambulatory Visit: Payer: Self-pay | Admitting: Nurse Practitioner

## 2018-11-21 DIAGNOSIS — I10 Essential (primary) hypertension: Secondary | ICD-10-CM

## 2018-11-22 NOTE — Telephone Encounter (Signed)
Lab Results  Component Value Date   CREATININE 0.75 11/09/2018   Lab Results  Component Value Date   K 4.4 11/09/2018

## 2018-11-23 NOTE — Telephone Encounter (Signed)
Erroneous entry

## 2018-11-26 ENCOUNTER — Inpatient Hospital Stay: Payer: Medicaid Other | Attending: Oncology | Admitting: Oncology

## 2018-12-08 ENCOUNTER — Ambulatory Visit: Payer: Medicaid Other | Attending: Family Medicine | Admitting: Physical Therapy

## 2018-12-15 ENCOUNTER — Ambulatory Visit: Payer: Medicaid Other | Admitting: Physical Therapy

## 2018-12-17 ENCOUNTER — Ambulatory Visit: Payer: Medicaid Other | Admitting: Physical Therapy

## 2018-12-20 ENCOUNTER — Ambulatory Visit: Payer: Medicaid Other | Admitting: Physical Therapy

## 2018-12-20 ENCOUNTER — Telehealth: Payer: Self-pay | Admitting: Family Medicine

## 2018-12-20 NOTE — Telephone Encounter (Signed)
Given to [place] to work on

## 2018-12-20 NOTE — Telephone Encounter (Signed)
Copied from CRM [id number]. Topic: Quick Communication - See Telephone Encounter >> Dec 20, 2018 11:21 AM Terisa Starr wrote: CRM for notification. See Telephone encounter for: 12/20/18.  Patient states pharmacy needs a PA on JANUMET 50-1000 MG tablet [[id number]]  DISCONTINUED ( not on current med list )  [facility] [address] [place] - [address] [place] [address] Phone: [phone] Fax: [phone]

## 2018-12-23 ENCOUNTER — Other Ambulatory Visit: Payer: Self-pay | Admitting: Nurse Practitioner

## 2018-12-23 DIAGNOSIS — E1165 Type 2 diabetes mellitus with hyperglycemia: Principal | ICD-10-CM

## 2018-12-23 DIAGNOSIS — E1140 Type 2 diabetes mellitus with diabetic neuropathy, unspecified: Secondary | ICD-10-CM

## 2018-12-23 DIAGNOSIS — IMO0002 Reserved for concepts with insufficient information to code with codable children: Secondary | ICD-10-CM

## 2018-12-23 NOTE — Telephone Encounter (Signed)
Pt called to follow up on PA for JANUMET. She has been out of medication for almost 2 weeks and her blood sugar levels have been very high. Please advise.

## 2018-12-24 NOTE — Telephone Encounter (Signed)
Left detailed voicemail

## 2018-12-24 NOTE — Telephone Encounter (Signed)
PA has been done online via [employer], waiting on response.

## 2018-12-24 NOTE — Telephone Encounter (Signed)
Patient no showed with hematology-oncology Please urge her to call and reschedule

## 2018-12-27 ENCOUNTER — Other Ambulatory Visit: Payer: Self-pay

## 2018-12-27 ENCOUNTER — Emergency Department
Admission: EM | Admit: 2018-12-27 | Discharge: 2018-12-27 | Disposition: A | Discharge status: Home / Self Care | Payer: Medicaid Other | Attending: Emergency Medicine | Admitting: Emergency Medicine

## 2018-12-27 ENCOUNTER — Encounter: Payer: Self-pay | Admitting: Intensive Care

## 2018-12-27 ENCOUNTER — Emergency Department: Payer: Medicaid Other

## 2018-12-27 ENCOUNTER — Encounter: Payer: Medicaid Other | Admitting: Physical Therapy

## 2018-12-27 ENCOUNTER — Ambulatory Visit: Payer: Self-pay | Admitting: *Deleted

## 2018-12-27 DIAGNOSIS — R079 Chest pain, unspecified: Secondary | ICD-10-CM | POA: Insufficient documentation

## 2018-12-27 DIAGNOSIS — R631 Polydipsia: Secondary | ICD-10-CM | POA: Diagnosis not present

## 2018-12-27 DIAGNOSIS — E1165 Type 2 diabetes mellitus with hyperglycemia: Secondary | ICD-10-CM | POA: Diagnosis not present

## 2018-12-27 DIAGNOSIS — R739 Hyperglycemia, unspecified: Secondary | ICD-10-CM

## 2018-12-27 DIAGNOSIS — R8271 Bacteriuria: Secondary | ICD-10-CM | POA: Insufficient documentation

## 2018-12-27 DIAGNOSIS — R358 Other polyuria: Secondary | ICD-10-CM | POA: Diagnosis not present

## 2018-12-27 DIAGNOSIS — F17210 Nicotine dependence, cigarettes, uncomplicated: Secondary | ICD-10-CM | POA: Insufficient documentation

## 2018-12-27 DIAGNOSIS — E1140 Type 2 diabetes mellitus with diabetic neuropathy, unspecified: Secondary | ICD-10-CM | POA: Diagnosis not present

## 2018-12-27 DIAGNOSIS — R42 Dizziness and giddiness: Secondary | ICD-10-CM | POA: Diagnosis not present

## 2018-12-27 LAB — BASIC METABOLIC PANEL
ANION GAP: 13 (ref 5–15)
BUN: 10 mg/dL (ref 6–20)
CO2: 20 mmol/L — ABNORMAL LOW (ref 22–32)
Calcium: 9.1 mg/dL (ref 8.9–10.3)
Chloride: 103 mmol/L (ref 98–111)
Creatinine, Ser: 0.61 mg/dL (ref 0.44–1.00)
GFR calc non Af Amer: >60 mL/min (ref >60)
Glucose, Bld: 340 mg/dL — ABNORMAL HIGH (ref 70–99)
POTASSIUM: 3.8 mmol/L (ref 3.5–5.1)
Sodium: 136 mmol/L (ref 135–145)

## 2018-12-27 LAB — TROPONIN I: Troponin I: <0.03 ng/mL (ref <0.03)

## 2018-12-27 LAB — GLUCOSE, CAPILLARY: GLUCOSE-CAPILLARY: 344 mg/dL — AB (ref 70–99)

## 2018-12-27 LAB — CBC
HCT: 44.9 % (ref 36.0–46.0)
HEMOGLOBIN: 15.3 g/dL — AB (ref 12.0–15.0)
MCH: 29.2 pg (ref 26.0–34.0)
MCHC: 34.1 g/dL (ref 30.0–36.0)
MCV: 85.7 fL (ref 80.0–100.0)
Platelets: 171 10*3/uL (ref 150–400)
RBC: 5.24 MIL/uL — ABNORMAL HIGH (ref 3.87–5.11)
RDW: 13.3 % (ref 11.5–15.5)
WBC: 10.4 10*3/uL (ref 4.0–10.5)
nRBC: 0 % (ref 0.0–0.2)

## 2018-12-27 LAB — URINALYSIS, COMPLETE (UACMP) WITH MICROSCOPIC
Bilirubin Urine: NEGATIVE
Glucose, UA: >=500 mg/dL — AB
Ketones, ur: 5 mg/dL — AB
Leukocytes,Ua: NEGATIVE
Nitrite: NEGATIVE
Protein, ur: NEGATIVE mg/dL
Specific Gravity, Urine: 1.01 (ref 1.005–1.030)
pH: 7 (ref 5.0–8.0)

## 2018-12-27 MED ORDER — SODIUM CHLORIDE 0.9% FLUSH: 3.0000 mL | Freq: Once | INTRAVENOUS | Status: DC

## 2018-12-27 MED ORDER — SULFAMETHOXAZOLE-TRIMETHOPRIM 800-160 MG PO TABS: 1.0000 | ORAL_TABLET | Freq: Two times a day (BID) | ORAL | 0 refills | Status: AC

## 2018-12-27 MED ORDER — SODIUM CHLORIDE 0.9 % IV BOLUS
1000.0000 mL | Freq: Once | INTRAVENOUS | Status: AC
  Administered 2018-12-27: 1000 mL via INTRAVENOUS

## 2018-12-27 MED ORDER — INSULIN ASPART 100 UNIT/ML ~~LOC~~ SOLN
10.0000 [IU] | Freq: Once | SUBCUTANEOUS | Status: AC
  Administered 2018-12-27: 10 [IU] via INTRAVENOUS
  Filled 2018-12-27: qty 1

## 2018-12-27 NOTE — Telephone Encounter (Signed)
Pt following up on this request. Pt states her blood sugars are all over the place.  Pt would like to know what is going on!  Pt has NO medicine. Pt needs this medication. Pt is going to come to the office in the am if she does not hear anything.  sitaGLIPtin-metformin (JANUMET) 50-1000 MG tablet  [facility] [address] [place] - [address] [phone] (Phone) [phone] (Fax)

## 2018-12-27 NOTE — Discharge Instructions (Signed)
Please make an appoint with your primary care physician.  Even if you are unable to get started back on metformin, your doctor may choose to put you on a sliding scale insulin, or another medication to better control your blood sugars.  Please make an appoint with Dr. Park Breed, the cardiologist on-call, for reevaluation of your chest pain and stress test if he feels that is indicated.  Return to the emergency department for severe pain, lightheadedness or fainting, inability to keep down fluids, fever, or any other symptoms concerning to you.

## 2018-12-27 NOTE — ED Provider Notes (Addendum)
[facility] Emergency Department Provider Note  ____________________________________________  Time seen: Approximately 5:47 PM  I have reviewed the triage vital signs and the nursing notes.   HISTORY  Chief Complaint Dizziness and Hyperglycemia    HPI Gwendolyn Fuller is a 58 y.o. female with a history of HTN, HL, COPD and DM, with insurance difficulties filling her metformin for the past 3 weeks, presenting for hyperglycemia.  The patient reports that for the past 3 weeks, she has been able to take her long-acting nighttime insulin, and has been taking glimepiride, but she has been having blood sugars in the 3 and 400s.  She has been following with her PCP, Dr. Sherie Don, while trying to figure out her insurance issues.  She reports that today she had an increase in her polyuria and polydipsia without any dysuria or hematuria.  Also, she was standing in her kitchen and she became acutely dizzy, and had to hold onto the sink not to fall down.  At the same time, she developed a chest pain that she describes as a squeezing sensation in the center of her chest as well as a stabbing sensation under her left breast without any shortness of breath, palpitations, syncope, diaphoresis, nausea or vomiting.  She reports that she has intermittently been having this chest pain for at least 2 weeks.  Her pain is not associated with exertion or food.  She has no cardiac history and last stress test was years ago.  SH: Positive tobacco.  Negative cocaine.  Past Medical History:  Diagnosis Date  . Arthritis   . Diabetes mellitus without complication (HCC)   . Dyspnea     Patient Active Problem List   Diagnosis Date Noted  . Leukocytosis 11/12/2018  . 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 Erythrocytosis 11/12/2018  . Cervical radiculopathy 11/09/2018  . Hx of smoking 11/09/2018  . Personal history of tobacco use, presenting hazards to health 10/04/2018  . Medication monitoring encounter  01/27/2018  . Chronic lower back pain 01/22/2018  . Screening for colon cancer 03/31/2017  . Vitamin D deficiency 04/22/2016  . Carpal tunnel syndrome, bilateral 04/21/2016  . Uncontrolled type 2 diabetes with neuropathy (HCC) 06/19/2015  . Essential hypertension 06/19/2015  . Hyperlipidemia 06/19/2015  . COLD (chronic obstructive lung disease) (HCC) 06/19/2015    Past Surgical History:  Procedure Laterality Date  . CARPAL TUNNEL RELEASE Bilateral   . COLONOSCOPY WITH PROPOFOL N/A 05/22/2017   Procedure: COLONOSCOPY WITH PROPOFOL;  Surgeon: Wyline Mood, MD;  Location: [facility] ENDOSCOPY;  Service: Endoscopy;  Laterality: N/A;    Current Outpatient Rx  . Order #: [id number] Class: Normal  . Order #: [id number] Class: Normal  . Order #: [id number] Class: Print  . Order #: [id number] Class: Normal  . Order #: [id number] Class: Normal  . Order #: [id number] Class: Normal  . Order #: [id number] Class: Normal  . Order #: [id number] Class: Normal  . Order #: [id number] Class: Normal  . Order #: [id number] Class: Normal  . Order #: [id number] Class: Normal  . Order #: [id number] Class: Historical Med  . Order #: [id number] Class: Normal  . Order #: [id number] Class: Normal  . Order #: [id number] Class: Normal  . Order #: [id number] Class: Normal  . Order #: [id number] Class: Print  . Order #: [id number] Class: Normal  . Order #: [id number] Class: Normal    Allergies Patient has no known allergies.  Family History  Problem Relation Age of Onset  . Diabetes Mother   . Cancer Mother        colon  . Heart disease Mother   .  Hypertension Mother   . Diabetes Father   . Parkinson's disease Father   . Alzheimer's disease Father   . Diabetes Maternal Grandmother   . Diabetes Paternal Grandmother   . Cancer Paternal Grandfather        stamoach?    Social History Social History   Tobacco Use  . Smoking status: Current Every Day Smoker    Packs/day: 0.50    Years: 37.00    Pack years: 18.50    Types: Cigarettes   . Smokeless tobacco: Never Used  Substance Use Topics  . Alcohol use: No  . Drug use: No    Review of Systems Constitutional: No fever/chills.  Positive for dizziness.  No syncope.  Positive medication noncompliance. Eyes: No visual changes. ENT: No sore throat. No congestion or rhinorrhea. Cardiovascular: Positive chest pain. Denies palpitations. Respiratory: Denies shortness of breath.  No cough. Gastrointestinal: No abdominal pain.  No nausea, no vomiting.  No diarrhea.  No constipation. Genitourinary: Negative for dysuria.  Positive for polyuria. Musculoskeletal: Negative for back pain. Skin: Negative for rash. Neurological: Negative for headaches. No focal numbness, tingling or weakness.  Psychiatric:Positive tobacco abuse. Endocrine:Positive hyperglycemia.  Positive for polydipsia and polyuria.    ____________________________________________   PHYSICAL EXAM:  VITAL SIGNS: ED Triage Vitals  Enc Vitals Group     BP 12/27/18 1714 140/77     Pulse Rate 12/27/18 1714 87     Resp 12/27/18 1714 11     Temp 12/27/18 1714 (!) 97.5 F (36.4 C)     Temp Source 12/27/18 1714 Oral     SpO2 12/27/18 1714 97 %     Weight 12/27/18 1715 146 lb (66.2 kg)     Height 12/27/18 1715 5\' 2"  (1.575 m)     Head Circumference --      Peak Flow --      Pain Score 12/27/18 1714 5     Pain Loc --      Pain Edu? --      Excl. in GC? --     Constitutional: Alert and oriented.  Answers questions appropriately. Eyes: Conjunctivae are normal.  EOMI. No scleral icterus. Head: Atraumatic. Nose: No congestion/rhinnorhea. Mouth/Throat: Mucous membranes are very dry.  Neck: No stridor.  Supple.  No JVD.  No meningismus. Cardiovascular: Normal rate, regular rhythm. No murmurs, rubs or gallops.  Respiratory: Normal respiratory effort.  No accessory muscle use or retractions. Lungs CTAB.  No wheezes, rales or ronchi. Gastrointestinal: Soft, nontender and nondistended.  No guarding or rebound.   No peritoneal signs. Musculoskeletal: No LE edema. No ttp in the calves or palpable cords.  Negative Homan's sign. Neurologic:  A&Ox3.  Speech is clear.  Face and smile are symmetric.  EOMI.  Moves all extremities well. Skin:  Skin is warm, dry and intact. No rash noted. Psychiatric: Mood and affect are normal. Speech and behavior are normal.  Normal judgement  ____________________________________________   LABS (all labs ordered are listed, but only abnormal results are displayed)  Labs Reviewed  BASIC METABOLIC PANEL - Abnormal; Notable for the following components:      Result Value   CO2 20 (*)    Glucose, Bld 340 (*)    All other components within normal limits  CBC - Abnormal; Notable for the following components:   RBC 5.24 (*)    Hemoglobin 15.3 (*)    All other components within normal limits  GLUCOSE, CAPILLARY - Abnormal; Notable for the following components:   Glucose-Capillary  344 (*)    All other components within normal limits  URINALYSIS, COMPLETE (UACMP) WITH MICROSCOPIC - Abnormal; Notable for the following components:   Color, Urine STRAW (*)    APPearance CLEAR (*)    Glucose, UA >=500 (*)    Hgb urine dipstick SMALL (*)    Ketones, ur 5 (*)    Bacteria, UA RARE (*)    All other components within normal limits  TROPONIN I  TROPONIN I   ____________________________________________  EKG  ED ECG REPORT I, Anne-Caroline Sharma Covert, the attending physician, personally viewed and interpreted this ECG.   Date: 12/27/2018  EKG Time: 1717  Rate: 81  Rhythm: normal sinus rhythm  Axis: normal  Intervals:none  ST&T Change: No STEMI  ____________________________________________  RADIOLOGY  Dg Chest 2 View  Result Date: 12/27/2018 CLINICAL DATA:  Chest pain. Smoker. EXAM: CHEST - 2 VIEW COMPARISON:  Chest radiographs dated 07/23/2018 and chest CT dated 10/04/2018. FINDINGS: Normal sized heart. Clear lungs. The lungs are mildly hyperexpanded with mild diffuse  peribronchial thickening. Unremarkable bones. Heat patch on the right shoulder. IMPRESSION: No acute abnormality.  Mild changes of COPD and chronic bronchitis. Electronically Signed   By: Beckie Salts M.D.   On: 12/27/2018 17:53    ____________________________________________   PROCEDURES  Procedure(s) performed: None  Procedures  Critical Care performed: No ____________________________________________   INITIAL IMPRESSION / ASSESSMENT AND PLAN / ED COURSE  Pertinent labs & imaging results that were available during my care of the patient were reviewed by me and considered in my medical decision making (see chart for details).  58 y.o. female with 3 weeks of inability to fill her prescription for metformin presenting with hyperglycemia, as well as an episode of dizziness with chest pain.  She has also been having intermittent chest pain for the past 2 to 3 weeks.  Overall, the patient is hemodynamically stable.  Here her CBG is in the 300s.  I have ordered intravenous fluids as well as insulin for her.  Clinically, she does appear intravascularly depleted.  We will check for DKA, UTI, and evaluate her chest pain.  Her EKG is reassuring without any ischemic changes and I am awaiting the results of her troponin; we will also repeat a troponin.  At this time, the patient is symptom-free.  Plan reevaluation for final disposition.  ----------------------------------------- 8:04 PM on 12/27/2018 -----------------------------------------  The patient's work-up in the emergency department has been reassuring.  She was hyperglycemic without DKA and has been treated with insulin and intravenous fluids.  In addition, her troponin is negative and a second troponin is pending.  I am awaiting the results of her urinalysis.  At this time, I have reevaluated the patient who continues to be hemodynamically stable and is feeling significantly better.  I talked her about making a follow-up with Dr. Sherie Don for  better glycemic control while awaiting her insurance to come through for her metformin.  In addition, I have asked her to make an appoint with Dr. Lennette Bihari, the cardiologist, for risk stratification study.  ----------------------------------------- 8:34 PM on 12/27/2018 -----------------------------------------  The patient has bacteriuria, and while she has no symptoms of UTI, this could be increasing her blood sugar so we will plan to treat her with 3 days of Bactrim. ____________________________________________  FINAL CLINICAL IMPRESSION(S) / ED DIAGNOSES  Final diagnoses:  Hyperglycemia  Dizziness  Chest pain, unspecified type  Bacteriuria         NEW MEDICATIONS STARTED DURING THIS VISIT:  New Prescriptions  SULFAMETHOXAZOLE-TRIMETHOPRIM (BACTRIM DS,SEPTRA DS) 800-160 MG TABLET    Take 1 tablet by mouth 2 (two) times daily for 3 days.      Rockne Menghini, MD 12/27/18 1754    Rockne Menghini, MD 12/27/18 2006    Rockne Menghini, MD 12/27/18 2034

## 2018-12-27 NOTE — ED Notes (Signed)
Patient transported to X-ray 

## 2018-12-27 NOTE — Telephone Encounter (Signed)
Patient called with a cbg of 436 at this time. This morning's was 235. Reporting she has not taken her Janumet 50-1000 mg tab for approximately 3 weeks. She is suppose to take it twice daily then her levemir 45 units at hs. Reports shaking, nervous at this time, increased thirst and frequent urination. Advised ER at this time. Stated she understood and will have someone drive her now. After our call ended I was reviewing her medications. She did not mention taking Amaryl. At this point, I can only assume she was aware/taking this medication for her blood sugar.  Reason for Disposition . Patient sounds very sick or weak to the triager  Answer Assessment - Initial Assessment Questions 1. BLOOD GLUCOSE: "What is your blood glucose level?"      436.   235 at 8:30a. 2. ONSET: "When did you check the blood glucose?"     Now at 3:41p 3. USUAL RANGE: "What is your glucose level usually?" (e.g., usual fasting morning value, usual evening value)     300-500's 4. KETONES: "Do you check for ketones (urine or blood test strips)?" If yes, ask: "What does the test show now?"     no 5. TYPE 1 or 2:  "Do you know what type of diabetes you have?"  (e.g., Type 1, Type 2, Gestational; doesn't know)     Type 2 6. INSULIN: "Do you take insulin?" "What type of insulin(s) do you use? What is the mode of delivery? (syringe, pen (e.g., injection or  pump)?"      At hs takes Levemir 7. DIABETES PILLS: "Do you take any pills for your diabetes?" If yes, ask: "Have you missed taking any pills recently?"     Takes Janumet one tab twice daily 8. OTHER SYMPTOMS: "Do you have any symptoms?" (e.g., fever, frequent urination, difficulty breathing, dizziness, weakness, vomiting)     Shaking, dizziness, nauseated. 9. PREGNANCY: "Is there any chance you are pregnant?" "When was your last menstrual period?"     no  Protocols used: DIABETES - HIGH BLOOD SUGAR-A-AH

## 2018-12-28 ENCOUNTER — Telehealth: Payer: Self-pay | Admitting: Family Medicine

## 2018-12-28 MED ORDER — METFORMIN HCL 1000 MG PO TABS: 1000.0000 mg | ORAL_TABLET | Freq: Two times a day (BID) | ORAL | 3 refills | Status: DC

## 2018-12-28 MED ORDER — SITAGLIPTIN PHOSPHATE 100 MG PO TABS: 100.0000 mg | ORAL_TABLET | Freq: Every day | ORAL | 1 refills | Status: AC

## 2018-12-28 NOTE — Telephone Encounter (Signed)
Patient was in the ER I had no idea that she was having issues getting her janumet I see a phone note that another staff member has been working on this Please contact her ASAP We can split the janumet into metformin and januvia and try to get this covered -- new Rxs sent If the [place] isn't covered, then we'll try another DPP4 I do NOT want her to go without medicine Appointment ASAP

## 2018-12-28 NOTE — Telephone Encounter (Signed)
To prevent ANY delay, please also start the PRIOR AUTH for Januvia 100 mg tablets  For the approval process: Januvia tablets are on the preferred December 26, 2018 Medicaid list  She is already on metformin She is already on glimepiride She is already on insulin  I don't know what more they can imagine her to have tried first without approving this

## 2018-12-28 NOTE — Telephone Encounter (Signed)
I spoke with patient I apologized that I never knew she had an issue getting her medicines Explained that I can split her medicine up into plain metformin and plain Januvia If the Januvia isn't covered, then I switch to another DPP-4, whatever her insurance will cover I again apologized for this delay I sent the new prescriptions to the pharmacy this morning

## 2018-12-28 NOTE — Telephone Encounter (Signed)
Patient called back and notified, will cal tomorrow to setup appt

## 2018-12-28 NOTE — Telephone Encounter (Signed)
crm

## 2018-12-28 NOTE — Telephone Encounter (Signed)
Please schedule her for an appointment with me tomorrow or Thursday Call her at [phone]

## 2018-12-28 NOTE — Telephone Encounter (Signed)
Spoke with pt and she will give [place] a call back to schedule the appt. Stated that she has to find a ride. Dr Corky Crafts for her to come in Wednesday or Thursday

## 2018-12-28 NOTE — Telephone Encounter (Signed)
Left detailed voicemail

## 2018-12-28 NOTE — Telephone Encounter (Signed)
So I need to find out which one of the following her insurance will cover if they don't cover Januvia:  Tradjenta Nesina Onglyza  They should cover one of these agents Back to me with answer Thank you

## 2018-12-28 NOTE — Telephone Encounter (Signed)
Pt called and stated that insurance will not cover sitaGLIPtin (JANUVIA) 100 MG tablet But they will cover metFormin. Please advise.

## 2018-12-29 ENCOUNTER — Telehealth: Payer: Self-pay | Admitting: Family Medicine

## 2018-12-29 ENCOUNTER — Encounter: Payer: Medicaid Other | Admitting: Physical Therapy

## 2018-12-29 NOTE — Telephone Encounter (Signed)
Copied from CRM [id number]. Topic: General - Other >> Dec 29, 2018  3:47 PM Tamela Oddi wrote: Reason for CRM: Patient called to ask if Dr. Sherie Don could work her in on tomorrow morning because this is the only time she can get a ride to the doctor's office.  Please advise and call patient as soon as possible.  CB# [phone]

## 2018-12-29 NOTE — Telephone Encounter (Signed)
Form filled out waiting for signature, to fax to medicaid.

## 2018-12-29 NOTE — Telephone Encounter (Signed)
Signed, returned to [facility]

## 2018-12-29 NOTE — Telephone Encounter (Signed)
lvm informing pt that at this current moment Dr Sherie Don does have a few opening for tomorrow.

## 2018-12-30 ENCOUNTER — Ambulatory Visit: Payer: Medicaid Other | Admitting: Family Medicine

## 2018-12-30 ENCOUNTER — Encounter: Payer: Self-pay | Admitting: Family Medicine

## 2018-12-30 VITALS — BP 130/72 | HR 89 | Temp 97.9°F | Resp 16 | Ht 62.0 in | Wt 149.1 lb

## 2018-12-30 DIAGNOSIS — D751 Secondary polycythemia: Secondary | ICD-10-CM

## 2018-12-30 DIAGNOSIS — E1165 Type 2 diabetes mellitus with hyperglycemia: Secondary | ICD-10-CM | POA: Diagnosis not present

## 2018-12-30 DIAGNOSIS — I10 Essential (primary) hypertension: Secondary | ICD-10-CM | POA: Diagnosis not present

## 2018-12-30 DIAGNOSIS — E1140 Type 2 diabetes mellitus with diabetic neuropathy, unspecified: Secondary | ICD-10-CM

## 2018-12-30 DIAGNOSIS — Z87891 Personal history of nicotine dependence: Secondary | ICD-10-CM | POA: Diagnosis not present

## 2018-12-30 DIAGNOSIS — IMO0002 Reserved for concepts with insufficient information to code with codable children: Secondary | ICD-10-CM

## 2018-12-30 LAB — GLUCOSE, POCT (MANUAL RESULT ENTRY): POC Glucose: 299 mg/dl — AB (ref 70–99)

## 2018-12-30 MED ORDER — INSULIN DETEMIR 100 UNIT/ML FLEXPEN: 48.0000 [IU] | PEN_INJECTOR | Freq: Every day | SUBCUTANEOUS | 1 refills | Status: DC

## 2018-12-30 MED ORDER — GLUCOSE BLOOD VI STRP: ORAL_STRIP | 1 refills | Status: DC

## 2018-12-30 MED ORDER — BLOOD PRESSURE KIT: PACK | 0 refills | Status: DC

## 2018-12-30 MED ORDER — ACCU-CHEK FASTCLIX LANCETS MISC: 1 refills | Status: DC

## 2018-12-30 NOTE — Progress Notes (Signed)
BP 130/72 (BP Location: Left Arm, Patient Position: Sitting, Cuff Size: Normal)   Pulse 89   Temp 97.9 F (36.6 C) (Oral)   Resp 16   Ht _0  (1.575 m)   Wt 149 lb 1.6 oz (67.6 kg)   SpO2 99%   BMI 27.27 kg/m    Subjective:    Patient ID: Gwendolyn Fuller, female    DOB: 1961/07/06, 58 y.o.   MRN: [id number]  HPI: Gwendolyn Fuller is a 58 y.o. female  Chief Complaint  Patient presents with  . Follow-up    HPI Patient is here for uncontrolled type 2 diabetes See previous chart messages; she ran out of [place]; insurance would not cover Her blood sugars got really high; over 500 and she felt terrible; went to the ER They also gave her TMP/SMX in the ER; just got that last night; took one pill Never had burning; reviewed urine, rare bacteria I spoke with her by phone after her ER visit and instead of Janumet, split up the medicine into metformin (approved and generic) and Januvia (still trying to get approval) Back on metformin now; had a little diarrhea yesterday but none today and doing much better; very pleased with how her sugars are coming down Really watching her diet; stopped the pasta, rice, bread; no sugars Not eating out very much Checking FSBS 4-5 x a day right now because sugars had been high Reviewed glucose readings: 251 this morning, 255 last night, 274, 316 (after eating), 175, 162 Dry mouth is getting better after getting back on the metformin; they gave her IV fluids in the ER Reviewed her other labs; troponin negative, trending RBCs going up; husband smokes 2 ppd and she is right next to him inhaling that stuff; recommended heme referral but patient declined and will have her husband smoke outside [place] still awaiting approval She can tell a big difference in glucose on the metformin again She would like to check her BP at home  Lab Results  Component Value Date   HGBA1C 10.1 (H) 11/09/2018     Depression screen [facility] 2/9 12/30/2018 11/09/2018 05/07/2018  01/22/2018 11/03/2017  Decreased Interest 0 0 0 0 0  Down, Depressed, Hopeless 0 0 0 0 0  PHQ - 2 Score 0 0 0 0 0  Altered sleeping 1 0 - - -  Tired, decreased energy 1 0 - - -  Change in appetite 0 0 - - -  Feeling bad or failure about yourself  0 0 - - -  Trouble concentrating 0 0 - - -  Moving slowly or fidgety/restless 0 0 - - -  Suicidal thoughts 0 0 - - -  PHQ-9 Score 2 0 - - -  Difficult doing work/chores Somewhat difficult Not difficult at all - - -  MD note: patient does not appear depressed today  Fall Risk  12/30/2018 11/09/2018 08/31/2018 05/07/2018 01/22/2018  Falls in the past year? _1 No No  Number falls in past yr: 1 1 0 - -  Injury with Fall? 0 0 0 - -  Risk for fall due to : - - - - -  Follow up Falls evaluation completed - - - -    Relevant past medical, surgical, family and social history reviewed Past Medical History:  Diagnosis Date  . Arthritis   . Diabetes mellitus without complication ([place])   . Dyspnea    Past Surgical History:  Procedure Laterality Date  . CARPAL TUNNEL RELEASE Bilateral   .  COLONOSCOPY WITH PROPOFOL N/A 05/22/2017   Procedure: COLONOSCOPY WITH PROPOFOL;  Surgeon: Jonathon Bellows, MD;  Location: [facility] ENDOSCOPY;  Service: Endoscopy;  Laterality: N/A;   Family History  Problem Relation Age of Onset  . Diabetes Mother   . Cancer Mother        colon  . Heart disease Mother   . Hypertension Mother   . Diabetes Father   . Parkinson's disease Father   . Alzheimer's disease Father   . Diabetes Maternal Grandmother   . Diabetes Paternal Grandmother   . Cancer Paternal Grandfather        stamoach?   Social History   Tobacco Use  . Smoking status: Current Every Day Smoker    Packs/day: 0.50    Years: 37.00    Pack years: 18.50    Types: Cigarettes  . Smokeless tobacco: Never Used  Substance Use Topics  . Alcohol use: No  . Drug use: No     Office Visit from 12/30/2018 in [facility]  AUDIT-C Score  0       Interim medical history since last visit reviewed. Allergies and medications reviewed  Review of Systems Per HPI unless specifically indicated above     Objective:    BP 130/72 (BP Location: Left Arm, Patient Position: Sitting, Cuff Size: Normal)   Pulse 89   Temp 97.9 F (36.6 C) (Oral)   Resp 16   Ht _0  (1.575 m)   Wt 149 lb 1.6 oz (67.6 kg)   SpO2 99%   BMI 27.27 kg/m   Wt Readings from Last 3 Encounters:  12/30/18 149 lb 1.6 oz (67.6 kg)  12/27/18 146 lb (66.2 kg)  11/09/18 146 lb 11.2 oz (66.5 kg)    Physical Exam Constitutional:      General: She is not in acute distress.    Appearance: She is well-developed.  HENT:     Mouth/Throat:     Mouth: Mucous membranes are moist.  Eyes:     General: No scleral icterus. Cardiovascular:     Rate and Rhythm: Normal rate and regular rhythm.  Pulmonary:     Effort: Pulmonary effort is normal.     Breath sounds: Normal breath sounds.  Abdominal:     General: There is no distension.  Neurological:     Mental Status: She is alert.  Psychiatric:        Mood and Affect: Mood is not anxious or depressed.        Behavior: Behavior normal.    Diabetic Foot Form - Detailed   Diabetic Foot Exam - detailed Diabetic Foot exam was performed with the following findings:  Yes 12/30/2018  1:11 PM  Visual Foot Exam completed.:  Yes  Pulse Foot Exam completed.:  Yes  Right Dorsalis Pedis:  Present Left Dorsalis Pedis:  Present  Sensory Foot Exam Completed.:  Yes Semmes-Weinstein Monofilament Test R Site 1-Great Toe:  Pos L Site 1-Great Toe:  Pos        Results for orders placed or performed in visit on 12/30/18  POCT Glucose (CBG)  Result Value Ref Range   POC Glucose 299 (A) 70 - 99 mg/dl      Assessment & Plan:   Problem List Items Addressed This Visit      Cardiovascular and Mediastinum   Essential hypertension    Rx written for a home blood pressure kit; she may select one of her choosing        Endocrine  Uncontrolled type 2 diabetes with neuropathy (HCC) - Primary    Increase insulin for now by 3 units daily while awaiting Januvia approval; may go back down to 45 units once sugars are back under 150; foot exam by MD today; I apologized to her again that she went without medicine and I'm glad that she is doing better now that she has the metformin; I am very proud of her for really working on her diet and making healthy dietary changes; I'll see her back next month for regular f/u, but she is encouraged to call me if sugars not controlled or any issues occur for further medicine changes if needed      Relevant Medications   Insulin Detemir (LEVEMIR FLEXTOUCH) 100 UNIT/ML Pen   glucose blood (ACCU-CHEK GUIDE) test strip   ACCU-CHEK FASTCLIX LANCETS MISC   Other Relevant Orders   POCT Glucose (CBG) (Completed)     Other   Personal history of tobacco use, presenting hazards to health    Chest CT lung cancer screening UTD      Erythrocytosis    Pointed out the trend upward; recommended hematology referral which patient politely declined; she will have her husband smoke outside and we'll plan to repeat this in April at her next appointment       Other Visit Diagnoses    Benign hypertension       Relevant Medications   Blood Pressure KIT       Follow up plan: No follow-ups on file.  An after-visit summary was printed and given to the patient at [place].  Please see the patient instructions which may contain other information and recommendations beyond what is mentioned above in the assessment and plan.  Meds ordered this encounter  Medications  . Insulin Detemir (LEVEMIR FLEXTOUCH) 100 UNIT/ML Pen    Sig: Inject 48 Units into the skin daily.    Dispense:  5 pen    Refill:  1  . glucose blood (ACCU-CHEK GUIDE) test strip    Sig: USE UP TO 3 TIMES A DAY AS DIRECTED; E11.65, LON 99 months    Dispense:  100 each    Refill:  1  . ACCU-CHEK FASTCLIX LANCETS MISC    Sig: Check FSBS  3 x a day; LON 99 months; E11.65    Dispense:  102 each    Refill:  1  . Blood Pressure KIT    Sig: Labile blood pressures; dx I10, check blood pressure 2x a day or as needed; LON 99 months    Dispense:  1 each    Refill:  0    Orders Placed This Encounter  Procedures  . POCT Glucose (CBG)

## 2018-12-30 NOTE — Assessment & Plan Note (Signed)
Increase insulin for now by 3 units daily while awaiting Januvia approval; may go back down to 45 units once sugars are back under 150; foot exam by MD today; I apologized to her again that she went without medicine and I'm glad that she is doing better now that she has the metformin; I am very proud of her for really working on her diet and making healthy dietary changes; I'll see her back next month for regular f/u, but she is encouraged to call me if sugars not controlled or any issues occur for further medicine changes if needed

## 2018-12-30 NOTE — Assessment & Plan Note (Signed)
Chest CT lung cancer screening UTD

## 2018-12-30 NOTE — Patient Instructions (Addendum)
Raw carrots are okay Please avoid cooked carrots Increase insulin from 45 to 48 units once a day until sugars are back down under 150 We'll try our best to get that Januvia approved ASAP I'm proud of your healthy eating habits I do encourage you to quit smoking and stay away from passive smoke exposure Call [phone] to sign up for smoking cessation classes You can call 1-800-QUIT-NOW to talk with a smoking cessation coach I'll see you in April, but call me before then if needed

## 2018-12-30 NOTE — Assessment & Plan Note (Signed)
Pointed out the trend upward; recommended hematology referral which patient politely declined; she will have her husband smoke outside and we'll plan to repeat this in April at her next appointment

## 2018-12-30 NOTE — Assessment & Plan Note (Signed)
Rx written for a home blood pressure kit; she may select one of her choosing 

## 2019-01-03 ENCOUNTER — Encounter: Payer: Medicaid Other | Admitting: Physical Therapy

## 2019-01-05 ENCOUNTER — Encounter: Payer: Medicaid Other | Admitting: Physical Therapy

## 2019-02-08 ENCOUNTER — Ambulatory Visit: Payer: Medicaid Other | Admitting: Family Medicine

## 2019-02-08 ENCOUNTER — Other Ambulatory Visit: Payer: Self-pay

## 2019-02-09 ENCOUNTER — Telehealth: Payer: Self-pay | Admitting: Family Medicine

## 2019-02-09 ENCOUNTER — Other Ambulatory Visit: Payer: Self-pay

## 2019-02-09 ENCOUNTER — Ambulatory Visit (INDEPENDENT_AMBULATORY_CARE_PROVIDER_SITE_OTHER): Payer: Medicaid Other | Admitting: Family Medicine

## 2019-02-09 ENCOUNTER — Encounter: Payer: Self-pay | Admitting: Family Medicine

## 2019-02-09 DIAGNOSIS — I10 Essential (primary) hypertension: Secondary | ICD-10-CM | POA: Diagnosis not present

## 2019-02-09 DIAGNOSIS — E559 Vitamin D deficiency, unspecified: Secondary | ICD-10-CM

## 2019-02-09 DIAGNOSIS — E1165 Type 2 diabetes mellitus with hyperglycemia: Secondary | ICD-10-CM

## 2019-02-09 DIAGNOSIS — E1140 Type 2 diabetes mellitus with diabetic neuropathy, unspecified: Secondary | ICD-10-CM | POA: Diagnosis not present

## 2019-02-09 DIAGNOSIS — E782 Mixed hyperlipidemia: Secondary | ICD-10-CM

## 2019-02-09 DIAGNOSIS — IMO0002 Reserved for concepts with insufficient information to code with codable children: Secondary | ICD-10-CM

## 2019-02-09 NOTE — Telephone Encounter (Signed)
Please schedule patient for an appointment With whom: Dr. Sherie Don When: 3 months or just AFTER How: In person Why: diabetes, high cholesterol Thank you

## 2019-02-09 NOTE — Progress Notes (Signed)
There were no vitals taken for this visit.   Subjective:    Patient ID: Gwendolyn Fuller, female    DOB: 09/22/1961, 58 y.o.   MRN: [id number]  HPI: Gwendolyn Fuller is a 58 y.o. female  Chief Complaint  Patient presents with  . Follow-up    HPI Virtual Visit via Telephone/Video Note   I connected with the patient via:  Telephone I verified that I am speaking with the correct person using two identifiers.  Call started: 10:17 am Call terminated: 10:28 am Total length of call: 11 minutes   I discussed the limitations, risks, and privacy concerns of performing an evaluation and management service by telephone and the availability of in-person appointments. I explained that he/she may be responsible for charges related to this service. The patient expressed understanding and agreed to proceed.  Provider location: office Patient location: home, socially isolating Additional participants: no one  Type 2 diabetes; stocked up on hand sanitizer; she is aware of high risk of complications from COVID-19 if she gets it, so she is trying to stay safe; family members in [place] have it, two cousins up there but they are doing okay; she is up to date on the news; sugars have been doing better on the medicine; 175 last sugar; none in the high range any more; limiting rice and pasta; just an occasional treat; occasional piece of pound cake just once a twice a month  Lab Results  Component Value Date   HGBA1C 10.1 (H) 11/09/2018   High blood pressure; she went to [facility] to get the BP monitor, but medicaid won't cover; staying out of the pharmacy so not checking there; not adding salt to her food; no decongestants  High cholesterol; taking medicine; she has cut down "a lot" on soda and coffee, used to drink 7-8 cups and those had sugar; trying to watch diet; lots of salads now on a daily basis, lots of lettuce and broccoli, little cucumber  Lab Results  Component Value Date   CHOL 280 (H)  11/09/2018   HDL 39 (L) 11/09/2018   LDLCALC  11/09/2018     Comment:     . LDL cholesterol not calculated. Triglyceride levels greater than 400 mg/dL invalidate calculated LDL results. . Reference range: <100 . Desirable range <100 mg/dL for primary prevention;   <70 mg/dL for patients with CHD or diabetic patients  with > or = 2 CHD risk factors. Marland Kitchen LDL-C is now calculated using the Martin-Hopkins  calculation, which is a validated novel method providing  better accuracy than the Friedewald equation in the  estimation of LDL-C.  Horald Pollen et al. Lenox Ahr. 9242;683(41): 2061-2068  (http://education.[facility].com/faq/FAQ164)    TRIG 600 (H) 11/09/2018   CHOLHDL 7.2 (H) 11/09/2018   Vitamin D deficiency; last level was 8  Depression screen [facility] 2/9 02/09/2019 12/30/2018 11/09/2018 05/07/2018 01/22/2018  Decreased Interest 0 0 0 0 0  Down, Depressed, Hopeless 0 0 0 0 0  PHQ - 2 Score 0 0 0 0 0  Altered sleeping 0 1 0 - -  Tired, decreased energy 0 1 0 - -  Change in appetite 0 0 0 - -  Feeling bad or failure about yourself  0 0 0 - -  Trouble concentrating 0 0 0 - -  Moving slowly or fidgety/restless 0 0 0 - -  Suicidal thoughts 0 0 0 - -  PHQ-9 Score 0 2 0 - -  Difficult doing work/chores Not difficult at all Somewhat  difficult Not difficult at all - -   Fall Risk  02/09/2019 12/30/2018 11/09/2018 08/31/2018 05/07/2018  Falls in the past year? No  Number falls in past yr: 0 -  Injury with Fall? 1 0 0 0 -  Risk for fall due to : - - - - -  Follow up - Falls evaluation completed - - -    Relevant past medical, surgical, family and social history reviewed Past Medical History:  Diagnosis Date  . Arthritis   . Diabetes mellitus without complication (HCC)   . Dyspnea    Past Surgical History:  Procedure Laterality Date  . CARPAL TUNNEL RELEASE Bilateral   . COLONOSCOPY WITH PROPOFOL N/A 05/22/2017   Procedure: COLONOSCOPY WITH PROPOFOL;  Surgeon: Wyline Mood,  MD;  Location: [facility] ENDOSCOPY;  Service: Endoscopy;  Laterality: N/A;   Family History  Problem Relation Age of Onset  . Diabetes Mother   . Cancer Mother        colon  . Heart disease Mother   . Hypertension Mother   . Diabetes Father   . Parkinson's disease Father   . Alzheimer's disease Father   . Diabetes Maternal Grandmother   . Diabetes Paternal Grandmother   . Cancer Paternal Grandfather        stamoach?   Social History   Tobacco Use  . Smoking status: Former Smoker    Types: Cigarettes    Last attempt to quit: 12/26/2018    Years since quitting: 0.1  . Smokeless tobacco: Never Used  Substance Use Topics  . Alcohol use: No  . Drug use: No     [facility] Visit from 02/09/2019 in [facility]  AUDIT-C Score  0      Interim medical history since last visit reviewed. Allergies and medications reviewed  Review of Systems Per HPI unless specifically indicated above     Objective:    There were no vitals taken for this visit.  Wt Readings from Last 3 Encounters:  12/30/18 149 lb 1.6 oz (67.6 kg)  12/27/18 146 lb (66.2 kg)  11/09/18 146 lb 11.2 oz (66.5 kg)    Physical Exam  Results for orders placed or performed in visit on 12/30/18  POCT Glucose (CBG)  Result Value Ref Range   POC Glucose 299 (A) 70 - 99 mg/dl      Assessment & Plan:   Problem List Items Addressed This Visit      Cardiovascular and Mediastinum   Essential hypertension    Controlled; continue meds; limiting salt        Endocrine   Uncontrolled type 2 diabetes with neuropathy (HCC)    She sounds to be doing much better on medicines; watching her diet; she is aware that she is at high risk for complications due to COVID-19 if she gets sick so she is taking precautions and watching the news; check A1c in a few weeks when pandemic slows down and she feels safe coming in for labs; btween 8-noon is best time, no sick individuals in the office in the morning here       Relevant Orders   Hemoglobin A1c   Basic metabolic panel     Other   Vitamin D deficiency    Check level      Relevant Orders   VITAMIN D 25 Hydroxy (Vit-D Deficiency, Fractures)   Hyperlipidemia    Glad that patient is trying to eat better; check lipids in  the next few weeks or months when pandemic slwos down and she feels safe coming in      Relevant Orders   Lipid panel       Follow up plan: Return in about 3 months (around 05/11/2019) for follow-up visit with Dr. Sherie DonLada face-to-face, labs in the next several weeks.  An after-visit summary was printed and given to the patient at check-out.  Please see the patient instructions which may contain other information and recommendations beyond what is mentioned above in the assessment and plan.  No orders of the defined types were placed in this encounter.   Orders Placed This Encounter  Procedures  . VITAMIN D 25 Hydroxy (Vit-D Deficiency, Fractures)  . Lipid panel  . Hemoglobin A1c  . Basic metabolic panel

## 2019-02-09 NOTE — Assessment & Plan Note (Signed)
She sounds to be doing much better on medicines; watching her diet; she is aware that she is at high risk for complications due to COVID-19 if she gets sick so she is taking precautions and watching the news; check A1c in a few weeks when pandemic slows down and she feels safe coming in for labs; btween 8-noon is best time, no sick individuals in the office in the morning here

## 2019-02-09 NOTE — Telephone Encounter (Signed)
Have tried several times. Line busy

## 2019-02-09 NOTE — Assessment & Plan Note (Signed)
Check level 

## 2019-02-09 NOTE — Assessment & Plan Note (Signed)
Glad that patient is trying to eat better; check lipids in the next few weeks or months when pandemic slwos down and she feels safe coming in

## 2019-02-09 NOTE — Assessment & Plan Note (Signed)
Controlled; continue meds; limiting salt

## 2019-02-14 ENCOUNTER — Other Ambulatory Visit: Payer: Self-pay | Admitting: Family Medicine

## 2019-02-15 NOTE — Telephone Encounter (Signed)
Reviewed last 3 vitamin D levels Very very low Will now maintain at just 50k once a MONTH

## 2019-03-12 ENCOUNTER — Other Ambulatory Visit: Payer: Self-pay | Admitting: Family Medicine

## 2019-03-12 DIAGNOSIS — IMO0002 Reserved for concepts with insufficient information to code with codable children: Secondary | ICD-10-CM

## 2019-03-12 DIAGNOSIS — E1140 Type 2 diabetes mellitus with diabetic neuropathy, unspecified: Secondary | ICD-10-CM

## 2019-03-13 ENCOUNTER — Other Ambulatory Visit: Payer: Self-pay | Admitting: Family Medicine

## 2019-03-13 DIAGNOSIS — IMO0002 Reserved for concepts with insufficient information to code with codable children: Secondary | ICD-10-CM

## 2019-03-13 DIAGNOSIS — E1140 Type 2 diabetes mellitus with diabetic neuropathy, unspecified: Secondary | ICD-10-CM

## 2019-03-17 IMAGING — CT CT CHEST LUNG CANCER SCREENING LOW DOSE W/O CM
2 of 5 series · 15 of 40 positions shown, 18 images · non-contrast
Comparison: No priors.

CLINICAL DATA: 57-year-old female former smoker (quit less than 1
year ago) with 37 pack year history of smoking. Lung cancer
screening examination.

EXAM:
CT CHEST WITHOUT CONTRAST LOW-DOSE FOR LUNG CANCER SCREENING
TECHNIQUE: Multidetector CT imaging of the chest was performed following the
standard protocol without IV contrast.

[Series 3: lung · axial · 0.59mm/px · z∈[-1209,-940]mm · 12 of 297 slices shown, 15 images (1 of 2)]
[im 14/297  mediastinal]
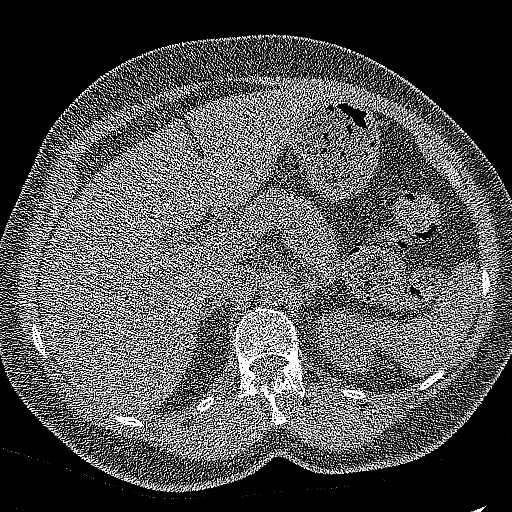
[im 14/297  lung]
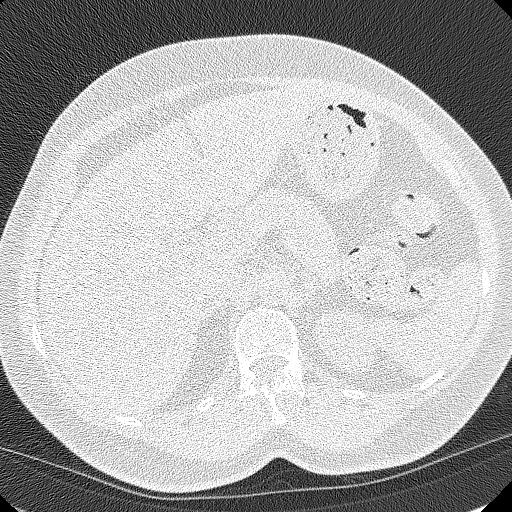
[im 41/297  lung]
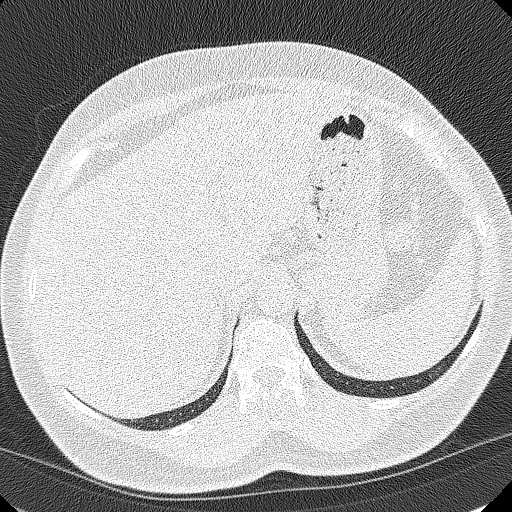
[im 68/297  lung]
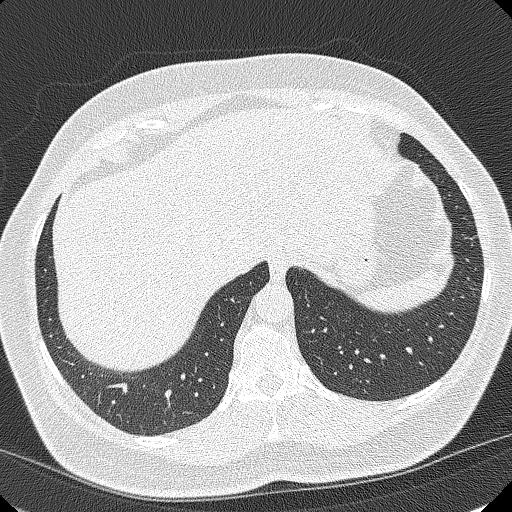
[im 95/297  lung]
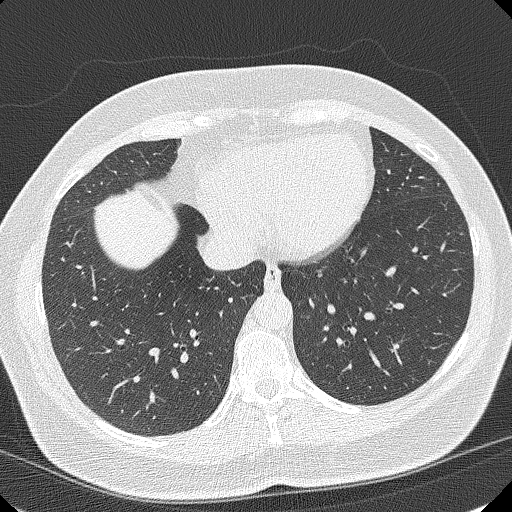
[im 108/297  mediastinal]
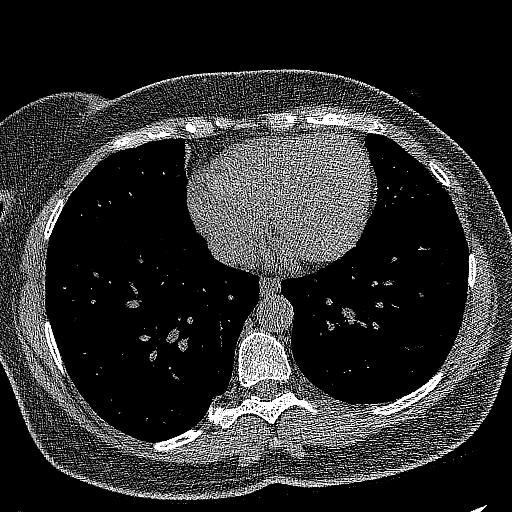
[im 108/297  lung]
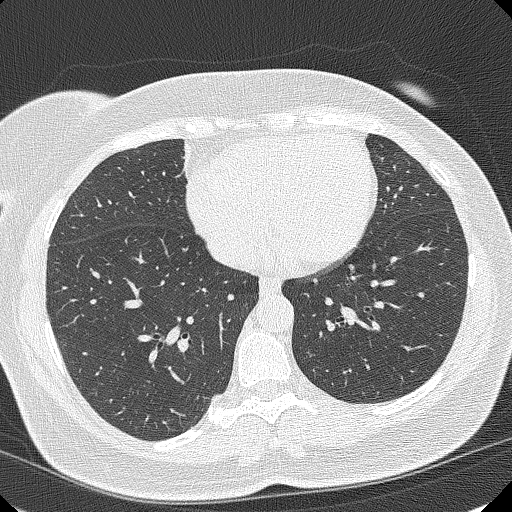
[im 135/297  lung]
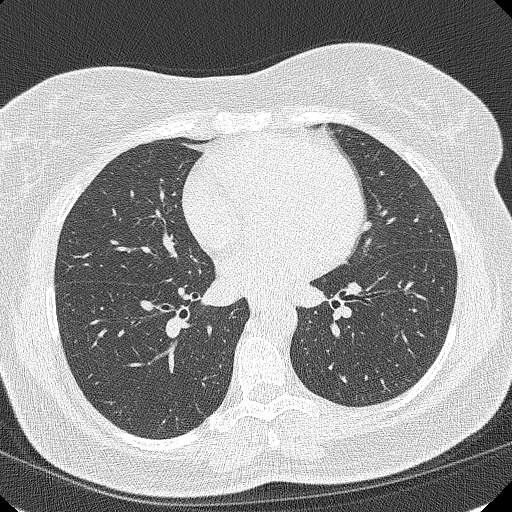
[im 162/297  lung]
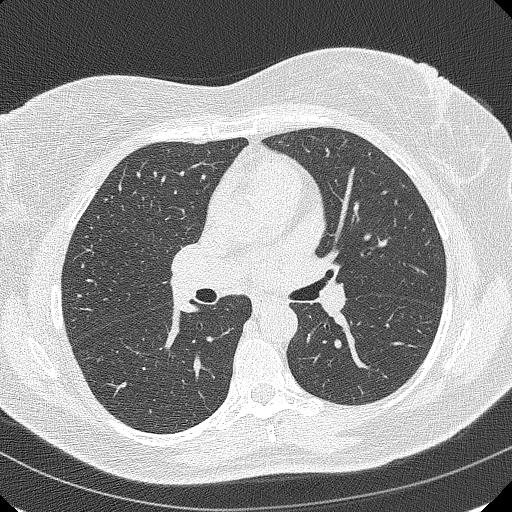
[im 189/297  lung]
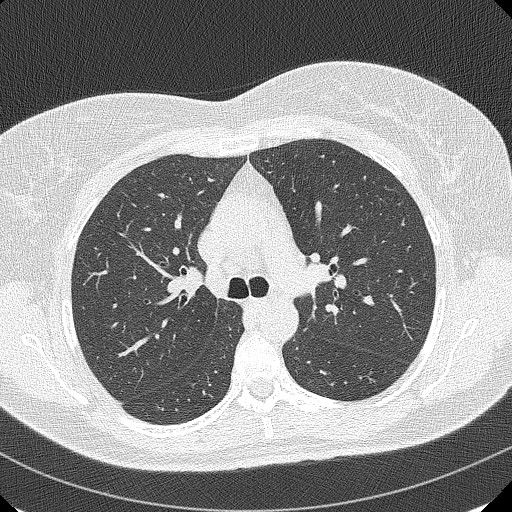
[im 202/297  mediastinal]
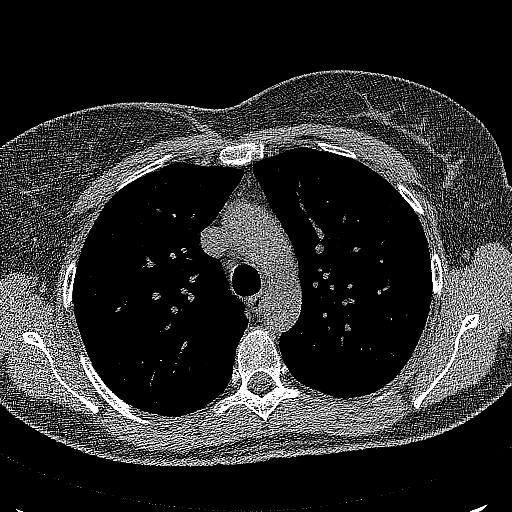
[im 202/297  lung]
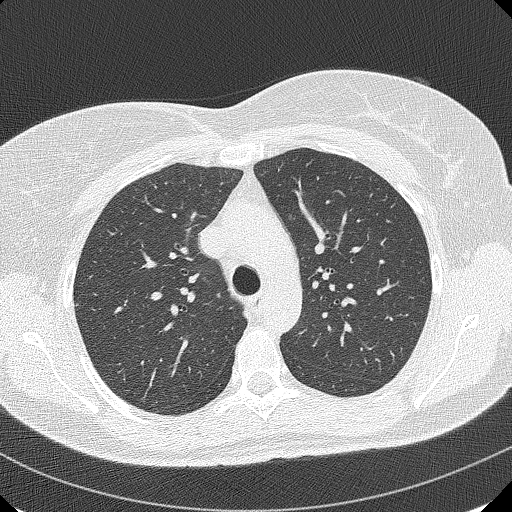
[im 229/297  lung]
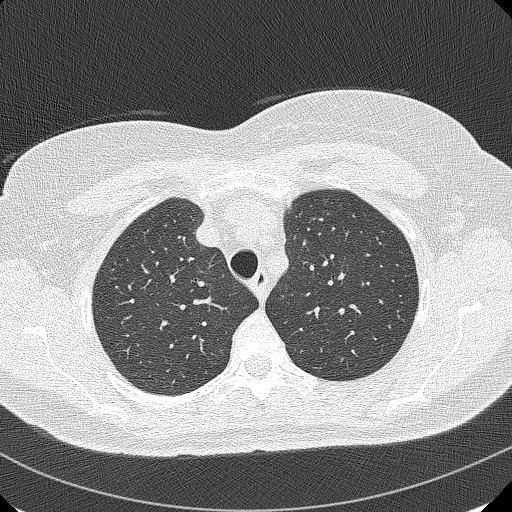
[im 256/297  lung]
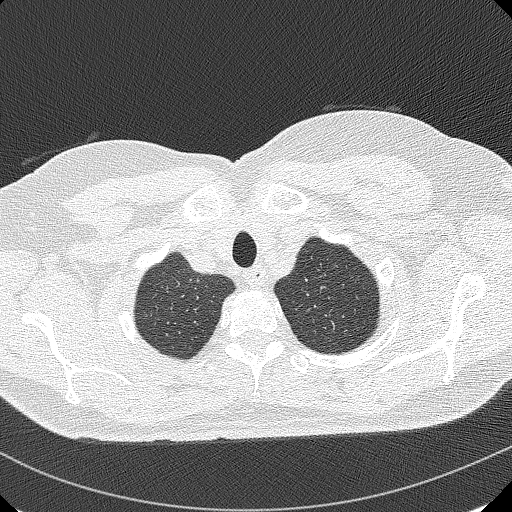
[im 283/297  lung]
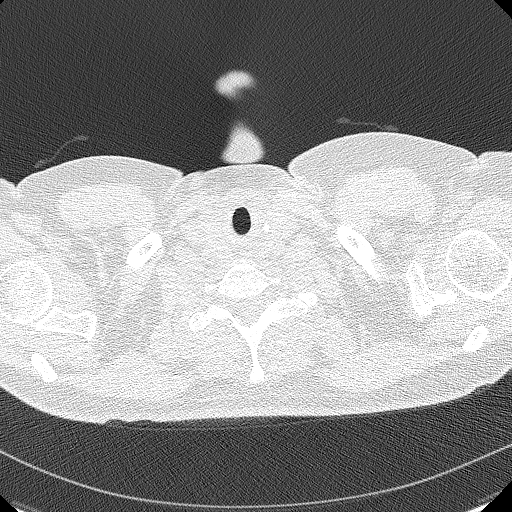

[Series 4: lung · coronal · 0.58mm/px · 3 of 302 slices shown (2 of 2)]
[im 61/302  lung]
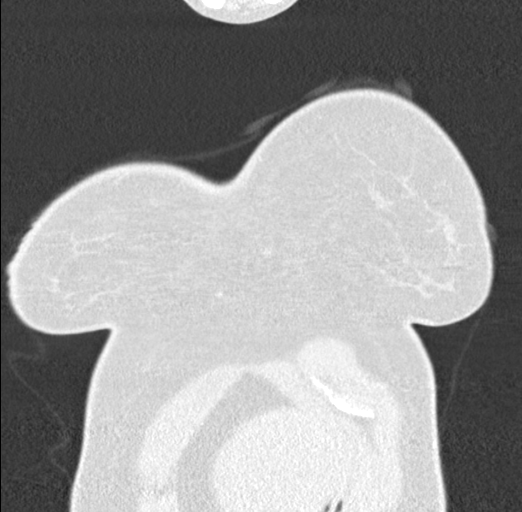
[im 121/302  lung]
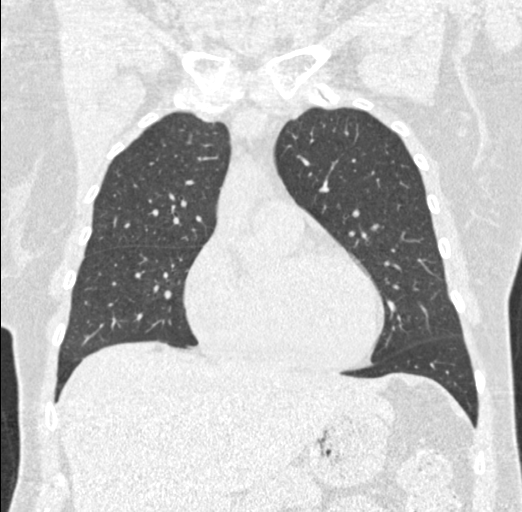
[im 181/302  lung]
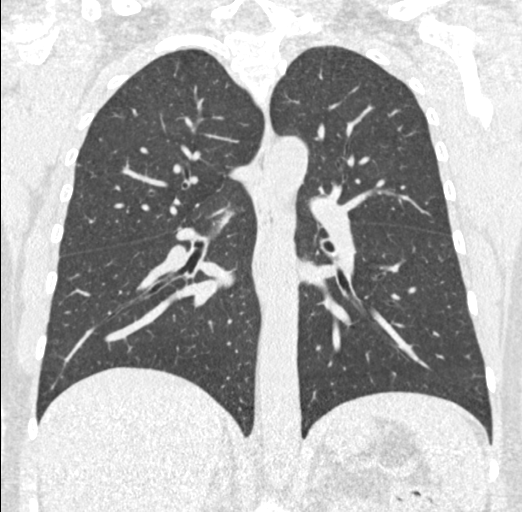

[15 of 40 positions shown; findings below may reference images not displayed]

FINDINGS: Cardiovascular: Heart size is normal. There is no significant
pericardial fluid, thickening or pericardial calcification. No
atherosclerotic calcifications noted in the thoracic aorta or the
coronary arteries.

Mediastinum/Nodes: No pathologically enlarged mediastinal or hilar
lymph nodes. Please note that accurate exclusion of hilar adenopathy
is limited on noncontrast CT scans. Esophagus is unremarkable in
appearance. No axillary lymphadenopathy.

Lungs/Pleura: Small pulmonary nodules are noted in the lungs,
largest of which is a subpleural nodule in the posterior aspect of
the right lower lobe (axial image 195 of series 3), with a volume
derived mean diameter of 7.8 mm. No larger more suspicious appearing
pulmonary nodules or masses are noted. No acute consolidative
airspace disease. No pleural effusions. Mild diffuse bronchial wall
thickening with very mild centrilobular and paraseptal emphysema.

Upper Abdomen: Unremarkable.

Musculoskeletal: There are no aggressive appearing lytic or blastic
lesions noted in the visualized portions of the skeleton.
IMPRESSION: 1. Lung-RADS 2, benign appearance or behavior. Continue annual
screening with low-dose chest CT without contrast in 12 months.
2. Mild diffuse bronchial wall thickening with very mild
centrilobular and paraseptal emphysema; imaging findings suggestive
of underlying COPD.

Emphysema ([id number]).

## 2019-03-26 ENCOUNTER — Other Ambulatory Visit: Payer: Self-pay | Admitting: Nurse Practitioner

## 2019-03-26 DIAGNOSIS — IMO0002 Reserved for concepts with insufficient information to code with codable children: Secondary | ICD-10-CM

## 2019-03-26 DIAGNOSIS — E1140 Type 2 diabetes mellitus with diabetic neuropathy, unspecified: Secondary | ICD-10-CM

## 2019-03-30 ENCOUNTER — Other Ambulatory Visit: Payer: Self-pay | Admitting: Family Medicine

## 2019-04-05 ENCOUNTER — Other Ambulatory Visit: Payer: Self-pay | Admitting: Family Medicine

## 2019-04-05 ENCOUNTER — Other Ambulatory Visit: Payer: Self-pay | Admitting: Nurse Practitioner

## 2019-04-05 DIAGNOSIS — J411 Mucopurulent chronic bronchitis: Secondary | ICD-10-CM

## 2019-05-11 ENCOUNTER — Other Ambulatory Visit: Payer: Self-pay

## 2019-05-11 ENCOUNTER — Encounter: Payer: Self-pay | Admitting: Nurse Practitioner

## 2019-05-11 ENCOUNTER — Ambulatory Visit (INDEPENDENT_AMBULATORY_CARE_PROVIDER_SITE_OTHER): Payer: Medicaid Other | Admitting: Nurse Practitioner

## 2019-05-11 VITALS — Resp 16

## 2019-05-11 DIAGNOSIS — E1165 Type 2 diabetes mellitus with hyperglycemia: Secondary | ICD-10-CM

## 2019-05-11 DIAGNOSIS — J411 Mucopurulent chronic bronchitis: Secondary | ICD-10-CM | POA: Diagnosis not present

## 2019-05-11 DIAGNOSIS — E782 Mixed hyperlipidemia: Secondary | ICD-10-CM

## 2019-05-11 DIAGNOSIS — E1140 Type 2 diabetes mellitus with diabetic neuropathy, unspecified: Secondary | ICD-10-CM | POA: Diagnosis not present

## 2019-05-11 DIAGNOSIS — I10 Essential (primary) hypertension: Secondary | ICD-10-CM

## 2019-05-11 DIAGNOSIS — IMO0002 Reserved for concepts with insufficient information to code with codable children: Secondary | ICD-10-CM

## 2019-05-11 DIAGNOSIS — Z5181 Encounter for therapeutic drug level monitoring: Secondary | ICD-10-CM

## 2019-05-11 MED ORDER — LISINOPRIL 10 MG PO TABS: 10.0000 mg | ORAL_TABLET | Freq: Every day | ORAL | 1 refills | Status: AC

## 2019-05-11 MED ORDER — METFORMIN HCL 1000 MG PO TABS: 1000.0000 mg | ORAL_TABLET | Freq: Two times a day (BID) | ORAL | 1 refills | Status: AC

## 2019-05-11 MED ORDER — ACCU-CHEK FASTCLIX LANCETS MISC: 1 refills | Status: AC

## 2019-05-11 NOTE — Progress Notes (Signed)
Virtual Visit via Video Note  I connected with Gwendolyn StackMaritza Addis on 05/11/19 at  2:40 PM EDT by a video enabled telemedicine application and verified that I am speaking with the correct person using two identifiers.   Staff discussed the limitations of evaluation and management by telemedicine and the availability of in person appointments. The patient expressed understanding and agreed to proceed.  Patient location: home  My location: work office Other people present: none  HPI  COPD Takes Symbicort 2 puffs BID and albuterol PRN. States only uses the albuterol maybe 0-2 times a week. Quit smoking in march 2020. Endorses chronic exertional shortness of breath and mucopurulent cough in the mornings.  Diabetes Mellitus Patient is rx metformin 1000mg  BID, amaryl 4mg  daily, januvia 100mg  daily and levemir 48 units. Takes medications as prescribed with no missed doses a month.  Diet: has really improved her diet- eating a lot of salads, stopped eating fried foods overall (ocassioanlly eats a few fries) baking chicken.  Checks blood sugars daily after breakfast it is between 170-253. She is on ACEi and statin. Checks feet, has neuropathy pain- takes gabapentin.  Denies polyphagia, polydipsia, polyuria.  Lab Results  Component Value Date   HGBA1C 10.1 (H) 11/09/2018    Hyperlipidemia Patient rx vascepa 2 g BID and crestor 40mg  daily Takes medications as prescribed with no missed doses a month.  Denies myaglia.  Lab Results  Component Value Date   CHOL 280 (H) 11/09/2018   HDL 39 (L) 11/09/2018   LDLCALC  11/09/2018     Comment:     . LDL cholesterol not calculated. Triglyceride levels greater than 400 mg/dL invalidate calculated LDL results. . Reference range: <100 . Desirable range <100 mg/dL for primary prevention;   <70 mg/dL for patients with CHD or diabetic patients  with > or = 2 CHD risk factors. Marland Kitchen. LDL-C is now calculated using the Martin-Hopkins  calculation, which is  a validated novel method providing  better accuracy than the Friedewald equation in the  estimation of LDL-C.  Horald PollenMartin SS et al. Lenox AhrJAMA. 1610;960(452013;310(19): 2061-2068  (http://education.[facility].com/faq/FAQ164)    TRIG 600 (H) 11/09/2018   CHOLHDL 7.2 (H) 11/09/2018   Hypertension Patient is on lisinopril 10mg .  Takes medications as prescribed with no missed doses a month.  She is compliant with low-salt diet.  Denies chest pain, headaches, blurry vision.   PHQ2/9: [facility] 2/9 05/11/2019 02/09/2019 12/30/2018 11/09/2018 05/07/2018  Decreased Interest 0 0 0 0 0  Down, Depressed, Hopeless 0 0 0 0 0  PHQ - 2 Score 0 0 0 0 0  Altered sleeping 0 0 1 0 -  Tired, decreased energy 0 0 1 0 -  Change in appetite 0 0 0 0 -  Feeling bad or failure about yourself  0 0 0 0 -  Trouble concentrating 0 0 0 0 -  Moving slowly or fidgety/restless 0 0 0 0 -  Suicidal thoughts 0 0 0 0 -  PHQ-9 Score 0 0 2 0 -  Difficult doing work/chores Not difficult at all Not difficult at all Somewhat difficult Not difficult at all -     PHQ reviewed. Negative  Patient Active Problem List   Diagnosis Date Noted  . Leukocytosis 11/12/2018  . 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 Erythrocytosis 11/12/2018  . Cervical radiculopathy 11/09/2018  . Personal history of tobacco use, presenting hazards to health 10/04/2018  . Chronic lower back pain 01/22/2018  . Vitamin D deficiency 04/22/2016  . Carpal tunnel syndrome,  bilateral 04/21/2016  . Uncontrolled type 2 diabetes with neuropathy ([place]) 06/19/2015  . Essential hypertension 06/19/2015  . Hyperlipidemia 06/19/2015  . COLD (chronic obstructive lung disease) ([place]) 06/19/2015    Past Medical History:  Diagnosis Date  . Arthritis   . Diabetes mellitus without complication ([place])   . Dyspnea     Past Surgical History:  Procedure Laterality Date  . CARPAL TUNNEL RELEASE Bilateral   . COLONOSCOPY WITH PROPOFOL N/A 05/22/2017   Procedure: COLONOSCOPY WITH  PROPOFOL;  Surgeon: Jonathon Bellows, MD;  Location[facility] ENDOSCOPY;  Service: Endoscopy;  Laterality: N/A;    Social History   Tobacco Use  . Smoking status: Former Smoker    Types: Cigarettes    Quit date: 12/26/2018    Years since quitting: 0.3  . Smokeless tobacco: Never Used  Substance Use Topics  . Alcohol use: No     Current Outpatient Medications:  .  albuterol (PROAIR HFA) 108 (90 Base) MCG/ACT inhaler, USE 2 PUFFS FOUR TIMES DAILY AS NEEDED, Disp: 8.5 Inhaler, Rfl: 2 .  budesonide-formoterol (SYMBICORT) 160-4.5 MCG/ACT inhaler, Inhale 2 puffs into the lungs 2 (two) times daily., Disp: 3 Inhaler, Rfl: 3 .  gabapentin (NEURONTIN) 300 MG capsule, TAKE 1 CAPSULE BY MOUTH TWICE A DAY, Disp: 180 capsule, Rfl: 1 .  glimepiride (AMARYL) 4 MG tablet, TAKE 2 TABLETS BY MOUTH EVERY DAY WITH BREAKFAST, Disp: 180 tablet, Rfl: 0 .  Icosapent Ethyl (VASCEPA) 1 g CAPS, Two capsules by mouth twice a day (to help with triglycerides), Disp: 120 capsule, Rfl: 11 .  LEVEMIR FLEXTOUCH 100 UNIT/ML Pen, INJECT 45 UNITS INTO THE SKIN DAILY., Disp: 15 mL, Rfl: 0 .  Lidocaine HCl 2 % SOLN, Use as directed in the mouth or throat., Disp: , Rfl:  .  lisinopril (PRINIVIL,ZESTRIL) 10 MG tablet, TAKE 1 TABLET BY MOUTH EVERY DAY, Disp: 30 tablet, Rfl: 5 .  meloxicam (MOBIC) 7.5 MG tablet, TAKE 1 TAB 2 TIMES DAILY AS NEEDED FOR PAIN NO OTHER NSAIDS, CAN TAKE ADDITIONAL ACETAMINOPHEN, Disp: 40 tablet, Rfl: 0 .  metFORMIN (GLUCOPHAGE) 1000 MG tablet, Take 1 tablet (1,000 mg total) by mouth 2 (two) times daily with a meal., Disp: 180 tablet, Rfl: 1 .  rosuvastatin (CRESTOR) 40 MG tablet, Take 1 tablet (40 mg total) by mouth daily., Disp: 90 tablet, Rfl: 3 .  sitaGLIPtin (JANUVIA) 100 MG tablet, Take 1 tablet (100 mg total) by mouth daily., Disp: 90 tablet, Rfl: 1 .  tiZANidine (ZANAFLEX) 2 MG tablet, TAKE ONE TABLET BY MOUTH THREE TIMES DAILY AS NEEDED, Disp: 50 tablet, Rfl: 2 .  Vitamin D, Ergocalciferol, (DRISDOL) 1.25  MG (50000 UT) CAPS capsule, Take 1 capsule (50,000 Units total) by mouth every 30 (thirty) days., Disp: 3 capsule, Rfl: 0 .  Accu-Chek FastClix Lancets MISC, Check fasting sugar 3 times a week and post-prandial (1-2 hours after eating) 3 times a week., Disp: 102 each, Rfl: 1 .  ACCU-CHEK GUIDE test strip, USE UP TO 3 TIMES A DAY AS DIRECTED E11.65, LON 99 MONTHS, Disp: 100 each, Rfl: 1 .  Insulin Pen Needle (UNIFINE PENTIPS) 32G X 4 MM MISC, Use as directed with [place]. Once a day, Disp: 30 each, Rfl: 5  No Known Allergies  ROS   No other specific complaints in a complete review of systems (except as listed in HPI above).  Objective  Vitals:   05/11/19 1521  Resp: 16     There is no height or weight on file to calculate  BMI.  Nursing Note and Vital Signs reviewed.  Physical Exam  Constitutional: Patient appears well-developed and well-nourished. No distress.  HENT: Head: Normocephalic and atraumatic. Pulmonary/Chest: Effort normal  Musculoskeletal: Normal range of motion,  Neurological: alert and oriented, speech normal.  Skin: No rash noted. No erythema.  Psychiatric: Patient has a normal mood and affect. behavior is normal. Judgment and thought content normal.    Assessment & Plan  1. Uncontrolled type 2 diabetes with neuropathy (HCC) Discussed fasting goals under 150, post-prandial goals under 200 Recommended med changes based off A1C- stop glimepride and start on jardiance, and switch levemir to tresiba- patient would like to wait to see.  - Accu-Chek FastClix Lancets MISC; Check fasting sugar 3 times a week and post-prandial (1-2 hours after eating) 3 times a week.  Dispense: 102 each; Refill: 1 - metFORMIN (GLUCOPHAGE) 1000 MG tablet; Take 1 tablet (1,000 mg total) by mouth 2 (two) times daily with a meal.  Dispense: 180 tablet; Refill: 1 - HgB A1c - lisinopril (ZESTRIL) 10 MG tablet; Take 1 tablet (10 mg total) by mouth daily.  Dispense: 90 tablet; Refill: 1  2.  Mucopurulent chronic bronchitis (HCC) Stable, discussed likely will improve now that she has quit smoking   3. Essential hypertension stable - lisinopril (ZESTRIL) 10 MG tablet; Take 1 tablet (10 mg total) by mouth daily.  Dispense: 90 tablet; Refill: 1  4. Mixed hyperlipidemia Will recheck, encouragement to continue lifestyle changes.  - Lipid Profile  5. Medication monitoring encounter - COMPLETE METABOLIC PANEL WITH GFR    Follow Up Instructions:    I discussed the assessment and treatment plan with the patient. The patient was provided an opportunity to ask questions and all were answered. The patient agreed with the plan and demonstrated an understanding of the instructions.   The patient was advised to call back or seek an in-person evaluation if the symptoms worsen or if the condition fails to improve as anticipated.  I provided 23 minutes of non-face-to-face time during this encounter.   Cheryle HorsfallElizabeth E Dakiya Puopolo, NP

## 2019-05-24 ENCOUNTER — Other Ambulatory Visit: Payer: Self-pay | Admitting: Family Medicine

## 2019-06-04 ENCOUNTER — Other Ambulatory Visit: Payer: Self-pay | Admitting: Nurse Practitioner

## 2019-06-14 ENCOUNTER — Ambulatory Visit: Payer: Medicaid Other | Admitting: Nurse Practitioner

## 2019-06-18 ENCOUNTER — Other Ambulatory Visit: Payer: Self-pay | Admitting: Family Medicine

## 2019-06-18 DIAGNOSIS — IMO0002 Reserved for concepts with insufficient information to code with codable children: Secondary | ICD-10-CM

## 2019-06-18 DIAGNOSIS — E1140 Type 2 diabetes mellitus with diabetic neuropathy, unspecified: Secondary | ICD-10-CM

## 2019-06-25 ENCOUNTER — Other Ambulatory Visit: Payer: Self-pay | Admitting: Nurse Practitioner

## 2019-06-25 DIAGNOSIS — E1140 Type 2 diabetes mellitus with diabetic neuropathy, unspecified: Secondary | ICD-10-CM

## 2019-06-25 DIAGNOSIS — IMO0002 Reserved for concepts with insufficient information to code with codable children: Secondary | ICD-10-CM

## 2019-06-30 ENCOUNTER — Ambulatory Visit (INDEPENDENT_AMBULATORY_CARE_PROVIDER_SITE_OTHER): Payer: Medicaid Other | Admitting: Nurse Practitioner

## 2019-06-30 ENCOUNTER — Other Ambulatory Visit: Payer: Self-pay | Admitting: Nurse Practitioner

## 2019-06-30 DIAGNOSIS — Z5329 Procedure and treatment not carried out because of patient's decision for other reasons: Secondary | ICD-10-CM

## 2019-06-30 DIAGNOSIS — IMO0002 Reserved for concepts with insufficient information to code with codable children: Secondary | ICD-10-CM

## 2019-06-30 DIAGNOSIS — E1140 Type 2 diabetes mellitus with diabetic neuropathy, unspecified: Secondary | ICD-10-CM

## 2019-06-30 DIAGNOSIS — Z91199 Patient's noncompliance with other medical treatment and regimen due to unspecified reason: Secondary | ICD-10-CM

## 2019-06-30 NOTE — Progress Notes (Signed)
No show

## 2019-07-13 ENCOUNTER — Encounter: Payer: Self-pay | Admitting: Family Medicine

## 2019-07-13 ENCOUNTER — Ambulatory Visit (INDEPENDENT_AMBULATORY_CARE_PROVIDER_SITE_OTHER): Payer: Medicaid Other | Admitting: Family Medicine

## 2019-07-13 ENCOUNTER — Other Ambulatory Visit: Payer: Self-pay

## 2019-07-13 DIAGNOSIS — E1165 Type 2 diabetes mellitus with hyperglycemia: Secondary | ICD-10-CM

## 2019-07-13 DIAGNOSIS — E1140 Type 2 diabetes mellitus with diabetic neuropathy, unspecified: Secondary | ICD-10-CM | POA: Diagnosis not present

## 2019-07-13 DIAGNOSIS — IMO0002 Reserved for concepts with insufficient information to code with codable children: Secondary | ICD-10-CM

## 2019-07-13 MED ORDER — LEVEMIR FLEXTOUCH 100 UNIT/ML ~~LOC~~ SOPN: PEN_INJECTOR | SUBCUTANEOUS | 0 refills | Status: DC

## 2019-07-13 NOTE — Progress Notes (Signed)
Name: Gwendolyn Fuller   MRN: [id number]    DOB: 19-Oct-1961   Date:07/13/2019       Progress Note  Subjective  Chief Complaint  Chief Complaint  Patient presents with  . Follow-up    1 month receck    I connected with  Gwendolyn Fuller  on 07/13/19 at 10:40 AM EDT by a video enabled telemedicine application and verified that I am speaking with the correct person using two identifiers.  I discussed the limitations of evaluation and management by telemedicine and the availability of in person appointments. The patient expressed understanding and agreed to proceed. Staff also discussed with the patient that there may be a patient responsible charge related to this service. Patient Location: Home Provider Location: Home Additional Individuals present: None  HPI  Pt presents for 1 month follow up on diabetes.  She last Saw Gwendolyn Fuller on 05/11/2019 for follow up.  Recommendation was to be taken off of glimepiride, started on Jardiance, and switched Levemir to [place], however patient never came in for labs, and therefore these changes were never made.  She is instead currently taking glimeperide 4mg , Januvia 100mg , and 48units Levemir daily.  She currently does not have transportation to come in for labs - we will connect with CCM for possible assistance.  She notes that today her fasting BG was 211, but that this is much better than her usual.  She notes that FBS have been lower - 179-250's.  She has been working hard on her diet; she is eating a lot of salads, vegetables, cut down on coffee with sugar, stopped sugar and increased water intake; avoiding rice and bread.  Once a week she has oatmeal.  Denies polyuria, polyphagia, or polydipsia.    Patient Active Problem List   Diagnosis Date Noted  . Leukocytosis 11/12/2018  . 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 Erythrocytosis 11/12/2018  . Cervical radiculopathy 11/09/2018  . Personal history of tobacco use, presenting hazards to health  10/04/2018  . Chronic lower back pain 01/22/2018  . Vitamin D deficiency 04/22/2016  . Carpal tunnel syndrome, bilateral 04/21/2016  . Uncontrolled type 2 diabetes with neuropathy ([place]) 06/19/2015  . Essential hypertension 06/19/2015  . Hyperlipidemia 06/19/2015  . COLD (chronic obstructive lung disease) ([place]) 06/19/2015    Past Surgical History:  Procedure Laterality Date  . CARPAL TUNNEL RELEASE Bilateral   . COLONOSCOPY WITH PROPOFOL N/A 05/22/2017   Procedure: COLONOSCOPY WITH PROPOFOL;  Surgeon: Jonathon Bellows, MD;  Location: [facility] ENDOSCOPY;  Service: Endoscopy;  Laterality: N/A;    Family History  Problem Relation Age of Onset  . Diabetes Mother   . Cancer Mother        colon  . Heart disease Mother   . Hypertension Mother   . Diabetes Father   . Parkinson's disease Father   . Alzheimer's disease Father   . Diabetes Maternal Grandmother   . Diabetes Paternal Grandmother   . Cancer Paternal Grandfather        stamoach?    Social History   Socioeconomic History  . Marital status: Married    Spouse name: Not on file  . Number of children: Not on file  . Years of education: Not on file  . Highest education level: Not on file  Occupational History  . Not on file  Social Needs  . Financial resource strain: Not on file  . Food insecurity    Worry: Not on file    Inability: Not on file  . Transportation  needs    Medical: Not on file    Non-medical: Not on file  Tobacco Use  . Smoking status: Former Smoker    Types: Cigarettes    Quit date: 12/26/2018    Years since quitting: 0.5  . Smokeless tobacco: Never Used  Substance and Sexual Activity  . Alcohol use: No  . Drug use: No  . Sexual activity: Yes  Lifestyle  . Physical activity    Days per week: Not on file    Minutes per session: Not on file  . Stress: Not on file  Relationships  . Social Musicianconnections    Talks on phone: Not on file    Gets together: Not on file    Attends religious service: Not on file     Active member of club or organization: Not on file    Attends meetings of clubs or organizations: Not on file    Relationship status: Not on file  . Intimate partner violence    Fear of current or ex partner: Not on file    Emotionally abused: Not on file    Physically abused: Not on file    Forced sexual activity: Not on file  Other Topics Concern  . Not on file  Social History Narrative  . Not on file     Current Outpatient Medications:  .  albuterol (PROAIR HFA) 108 (90 Base) MCG/ACT inhaler, USE 2 PUFFS FOUR TIMES DAILY AS NEEDED, Disp: 8.5 Inhaler, Rfl: 2 .  budesonide-formoterol (SYMBICORT) 160-4.5 MCG/ACT inhaler, Inhale 2 puffs into the lungs 2 (two) times daily., Disp: 3 Inhaler, Rfl: 3 .  gabapentin (NEURONTIN) 300 MG capsule, TAKE 1 CAPSULE BY MOUTH TWICE A DAY, Disp: 180 capsule, Rfl: 1 .  glimepiride (AMARYL) 4 MG tablet, TAKE 2 TABLETS BY MOUTH EVERY DAY WITH BREAKFAST, Disp: 180 tablet, Rfl: 0 .  Icosapent Ethyl (VASCEPA) 1 g CAPS, Two capsules by mouth twice a day (to help with triglycerides), Disp: 120 capsule, Rfl: 11 .  LEVEMIR FLEXTOUCH 100 UNIT/ML Pen, INJECT 45 UNITS INTO THE SKIN DAILY., Disp: 15 mL, Rfl: 0 .  Lidocaine HCl 2 % SOLN, Use as directed in the mouth or throat., Disp: , Rfl:  .  lisinopril (ZESTRIL) 10 MG tablet, Take 1 tablet (10 mg total) by mouth daily., Disp: 90 tablet, Rfl: 1 .  meloxicam (MOBIC) 7.5 MG tablet, TAKE 1 TAB 2 TIMES DAILY AS NEEDED FOR PAIN NO OTHER NSAIDS, CAN TAKE ADDITIONAL ACETAMINOPHEN, Disp: 40 tablet, Rfl: 0 .  metFORMIN (GLUCOPHAGE) 1000 MG tablet, Take 1 tablet (1,000 mg total) by mouth 2 (two) times daily with a meal., Disp: 180 tablet, Rfl: 1 .  rosuvastatin (CRESTOR) 40 MG tablet, Take 1 tablet (40 mg total) by mouth daily., Disp: 90 tablet, Rfl: 3 .  sitaGLIPtin (JANUVIA) 100 MG tablet, Take 1 tablet (100 mg total) by mouth daily., Disp: 90 tablet, Rfl: 1 .  tiZANidine (ZANAFLEX) 2 MG tablet, TAKE ONE TABLET BY MOUTH  THREE TIMES DAILY AS NEEDED, Disp: 50 tablet, Rfl: 2 .  Vitamin D, Ergocalciferol, (DRISDOL) 1.25 MG (50000 UT) CAPS capsule, TAKE 1 CAPSULE (50,000 UNITS TOTAL) BY MOUTH EVERY 30 (THIRTY) DAYS., Disp: 3 capsule, Rfl: 0 .  Accu-Chek FastClix Lancets MISC, Check fasting sugar 3 times a week and post-prandial (1-2 hours after eating) 3 times a week. (Patient not taking: Reported on 07/13/2019), Disp: 102 each, Rfl: 1 .  ACCU-CHEK GUIDE test strip, USE UP TO 3 TIMES A DAY AS DIRECTED E11.65,  LON 99 MONTHS (Patient not taking: Reported on 07/13/2019), Disp: 100 strip, Rfl: 1 .  UNIFINE PENTIPS 32G X 4 MM MISC, USE AS DIRECTED WITH TRESIBA. ONCE A DAY (Patient not taking: Reported on 07/13/2019), Disp: 100 each, Rfl: 5  No Known Allergies  I personally reviewed active problem list, medication list, allergies, notes from last encounter, lab results with the patient/caregiver today.   ROS  Ten systems reviewed and is negative except as mentioned in HPI  Objective  Virtual encounter, vitals not obtained.  There is no height or weight on file to calculate BMI.  Physical Exam  Pulmonary/Chest: Effort normal. No respiratory distress. Speaking in complete sentences Neurological: Pt is alert and oriented to person, place, and time. Speech is normal. Psychiatric: Patient has a normal mood and affect. behavior is normal. Judgment and thought content normal.  No results found for this or any previous visit (from the past 72 hour(s)).  PHQ2/9: Depression screen [facility] 2/9 07/13/2019 05/11/2019 02/09/2019 12/30/2018 11/09/2018  Decreased Interest 0 0 0 0 0  Down, Depressed, Hopeless 0 0 0 0 0  PHQ - 2 Score 0 0 0 0 0  Altered sleeping 0 0 0 1 0  Tired, decreased energy 0 0 0 1 0  Change in appetite 0 0 0 0 0  Feeling bad or failure about yourself  0 0 0 0 0  Trouble concentrating 0 0 0 0 0  Moving slowly or fidgety/restless 0 0 0 0 0  Suicidal thoughts 0 0 0 0 0  PHQ-9 Score 0 0 0 2 0  Difficult doing  work/chores Not difficult at all Not difficult at all Not difficult at all Somewhat difficult Not difficult at all   PHQ-2/9 Result is negative.    Fall Risk: Fall Risk  07/13/2019 05/11/2019 02/09/2019 12/30/2018 11/09/2018  Falls in the past year? 1 0 1 1 1   Number falls in past yr: 0 0 1 1 1   Injury with Fall? 0 0 1 0 0  Risk for fall due to : - - - - -  Follow up Falls evaluation completed - - Falls evaluation completed -    Assessment & Plan  1. Uncontrolled type 2 diabetes with neuropathy [facility]) - Referral to CCM team for assistance in transportation as she needs labs done prior to making additional medication adjustments.  Increase Levemir to 50 units daily; however I did recommend she connect with CCM PharmD to titrate up ongoing, but she declines this today.  Wants to speak with social work first. - Ambulatory referral to Chronic Care Management Services - Insulin Detemir (LEVEMIR FLEXTOUCH) 100 UNIT/ML Pen; INJECT 50 UNITS INTO THE SKIN DAILY.  Dispense: 15 mL; Refill: 0   I discussed the assessment and treatment plan with the patient. The patient was provided an opportunity to ask questions and all were answered. The patient agreed with the plan and demonstrated an understanding of the instructions.  The patient was advised to call back or seek an in-person evaluation if the symptoms worsen or if the condition fails to improve as anticipated.  I provided 13 minutes of non-face-to-face time during this encounter.

## 2019-07-19 ENCOUNTER — Ambulatory Visit: Payer: Self-pay | Admitting: *Deleted

## 2019-07-19 NOTE — Chronic Care Management (AMB) (Signed)
    Care Management   Unsuccessful Call Note 07/19/2019 Name: Gwendolyn Fuller MRN: [id number] DOB: Nov 20, 1960  Patient  is a 58  year old female who sees Raelyn Ensign, FNP for primary care. Raelyn Ensign, FNP asked the CCM team to consult the patient for transportation resources. Referral was placed 07/13/19. Patient's last office visit was 07/13/19.     This social worker was unable to reach patient via telephone today to discuss CM services and provide patient with the contact number Medicaid transportation [phone] . The line was busy after dialing the number multiple times. (unsuccessful outreach #1).   Plan: Will follow-up within 7 business days via telephone.     Elliot Gurney, [place] Administrator, [facility] Care Management [phone]

## 2019-08-02 ENCOUNTER — Ambulatory Visit: Payer: Self-pay | Admitting: *Deleted

## 2019-08-02 ENCOUNTER — Encounter: Payer: Self-pay | Admitting: *Deleted

## 2019-08-02 DIAGNOSIS — Z5329 Procedure and treatment not carried out because of patient's decision for other reasons: Secondary | ICD-10-CM

## 2019-08-02 DIAGNOSIS — Z91199 Patient's noncompliance with other medical treatment and regimen due to unspecified reason: Secondary | ICD-10-CM

## 2019-08-02 NOTE — Chronic Care Management (AMB) (Signed)
    Care Management   Unsuccessful Call Note 08/02/2019 Name: Gwendolyn Fuller MRN: [id number] DOB: Sep 06, 1961  Patient is a 58 year old female who sees Raelyn Ensign, FNP for primary care. Raelyn Ensign, FNP asked the CCM team to consult the patient for transportation resources.     This social worker was unable to reach patient via telephone today to provided her with transportation resources. I have left HIPAA compliant voicemail asking patient to return my call. (unsuccessful outreach #2).   Plan: Will follow-up within 7 business days via telephone.     Elliot Gurney, [place] Administrator, [facility] Center[facility] Care Management [phone]

## 2019-08-03 ENCOUNTER — Encounter: Payer: Self-pay | Admitting: *Deleted

## 2019-08-03 NOTE — Progress Notes (Signed)
This encounter was created in error - please disregard.

## 2019-08-03 NOTE — Patient Instructions (Addendum)
Thank you allowing the Chronic Care Management Team to be a part of your care! It was a pleasure speaking with you today!  1. Please contact your medicaid worker at the [facility] to get connected with transportation covered by medicaid 2. Please call this social worker if there are any questions or concerns regarding your transportation needs covered by Medicaid  CCM (Chronic Care Management) Team    Gwendolyn Fuller PharmD  Clinical Pharmacist  [phone]   [place], [place] Social Worker [phone]  Goals Addressed            This Visit's Progress   . "I need help getting to the doctor's office and pharmacy" (pt-stated)       Current Barriers:  . Lacks knowledge of community resource: transportation resources  Clinical Social Work Clinical Goal(s):  Marland Kitchen Over the next 90 days, client will follow up with her medicaid worker to complete transportation as directed by SW  Interventions: . Patient interviewed and appropriate assessments performed . Explored transportation needs to medical appointments and the pharmacy . Provided patient with information about Medicaid transportation and how to get connected . Discussed plans with patient for ongoing care management follow up and provided patient with direct contact information for care management team . Advised patient to contact her medicaid worker to complete transportation assessment   Patient Self Care Activities:  . Performs ADL's independently . Performs IADL's independently . Lacks knowledge of community resources to meet her transportation needs  Initial goal documentation         The patient verbalized understanding of instructions provided today and declined a print copy of patient instruction materials.   The patient will call her medicaid worker  as advised to complete transportation assessment.  Follow up appointment scheduled for 08/09/19

## 2019-08-03 NOTE — Chronic Care Management (AMB) (Signed)
Care Management    Clinical Social Work General Note  08/03/2019 Name: Zandria Woldt MRN: [id number] DOB: 1961-01-24  Keiva Dina is a 58 y.o. year old female who is a primary care patient of Doren Custard, FNP. The CCM was consulted to assist the patient with [employer] .   Ms. Moyd was given information about  Care Management services today including:  1. CM service includes personalized support from designated clinical staff supervised by her physician, including individualized plan of care and coordination with other care providers 2. 24/7 contact phone numbers for assistance for urgent and routine care needs. 3. The patient may stop CM services at any time (effective at the end of the month) by phone call to the office staff.   Patient agreed to services and verbal consent obtained.   Review of patient status, including review of consultants reports, relevant laboratory and other test results, and collaboration with appropriate care team members and the patient's provider was performed as part of comprehensive patient evaluation and provision of chronic care management services.    SDOH (Social Determinants of Health) screening performed today. See Care Plan Entry related to challenges with: Transportation  Advanced Directives Status: <no information> See Care Plan for related entries.   Outpatient Encounter Medications as of 08/02/2019  Medication Sig  . Accu-Chek FastClix Lancets MISC Check fasting sugar 3 times a week and post-prandial (1-2 hours after eating) 3 times a week.  Marland Kitchen ACCU-CHEK GUIDE test strip USE UP TO 3 TIMES A DAY AS DIRECTED E11.65, LON 99 MONTHS  . albuterol (PROAIR HFA) 108 (90 Base) MCG/ACT inhaler USE 2 PUFFS FOUR TIMES DAILY AS NEEDED  . budesonide-formoterol (SYMBICORT) 160-4.5 MCG/ACT inhaler Inhale 2 puffs into the lungs 2 (two) times daily.  Marland Kitchen gabapentin (NEURONTIN) 300 MG capsule TAKE 1 CAPSULE BY MOUTH TWICE A DAY  . glimepiride  (AMARYL) 4 MG tablet TAKE 2 TABLETS BY MOUTH EVERY DAY WITH BREAKFAST  . Icosapent Ethyl (VASCEPA) 1 g CAPS Two capsules by mouth twice a day (to help with triglycerides)  . Insulin Detemir (LEVEMIR FLEXTOUCH) 100 UNIT/ML Pen INJECT 50 UNITS INTO THE SKIN DAILY.  Marland Kitchen Lidocaine HCl 2 % SOLN Use as directed in the mouth or throat.  Marland Kitchen lisinopril (ZESTRIL) 10 MG tablet Take 1 tablet (10 mg total) by mouth daily.  . meloxicam (MOBIC) 7.5 MG tablet TAKE 1 TAB 2 TIMES DAILY AS NEEDED FOR PAIN NO OTHER NSAIDS, CAN TAKE ADDITIONAL ACETAMINOPHEN  . metFORMIN (GLUCOPHAGE) 1000 MG tablet Take 1 tablet (1,000 mg total) by mouth 2 (two) times daily with a meal.  . rosuvastatin (CRESTOR) 40 MG tablet Take 1 tablet (40 mg total) by mouth daily.  . sitaGLIPtin (JANUVIA) 100 MG tablet Take 1 tablet (100 mg total) by mouth daily.  Marland Kitchen tiZANidine (ZANAFLEX) 2 MG tablet TAKE ONE TABLET BY MOUTH THREE TIMES DAILY AS NEEDED  . UNIFINE PENTIPS 32G X 4 MM MISC USE AS DIRECTED WITH TRESIBA. ONCE A DAY  . Vitamin D, Ergocalciferol, (DRISDOL) 1.25 MG (50000 UT) CAPS capsule TAKE 1 CAPSULE (50,000 UNITS TOTAL) BY MOUTH EVERY 30 (THIRTY) DAYS.   No facility-administered encounter medications on file as of 08/02/2019.     Goals Addressed            This Visit's Progress   . "I need help getting to the doctor's office and pharmacy" (pt-stated)       Current Barriers:  . Lacks knowledge of community resource: transportation resources  Clinical Social Work Clinical Goal(s):  Marland Kitchen Over the next 90 days, client will follow up with her medicaid worker to complete transportation as directed by SW  Interventions: . Patient interviewed and appropriate assessments performed . Explored transportation needs to medical appointments and the pharmacy . Provided patient with information about Medicaid transportation and how to get connected . Discussed plans with patient for ongoing care management follow up and provided patient with  direct contact information for care management team . Advised patient to contact her medicaid worker to complete transportation assessment   Patient Self Care Activities:  . Performs ADL's independently . Performs IADL's independently . Lacks knowledge of community resources to meet her transportation needs  Initial goal documentation         Follow Up Plan: Appointment scheduled for SW follow up with client by phone on: 08/09/19      Elliot Gurney, [place] Worker  [facility] Care Management [phone]

## 2019-08-03 NOTE — Chronic Care Management (AMB) (Deleted)
   Care Management   Unsuccessful Call Note 08/03/2019 Name: Gwendolyn Fuller MRN: [id number] DOB: [birth year]  Patient  is a 58 year old female who sees Raelyn Ensign, FNP for primary care. Raelyn Ensign, FNP asked the CCM team to consult the patient for community resources to address her transportation needs.  Referral was placed ***. Patient's last office visit was ***.     Was unable to reach patient via telephone today for ***. I have left HIPAA compliant voicemail asking patient to return my call. (unsuccessful outreach #***).   Plan: Will follow-up within 7 business days via telephone.     Signature

## 2019-08-09 ENCOUNTER — Telehealth: Payer: Self-pay | Admitting: *Deleted

## 2019-08-09 ENCOUNTER — Ambulatory Visit: Payer: Self-pay | Admitting: *Deleted

## 2019-08-09 NOTE — Chronic Care Management (AMB) (Signed)
    Care Management   Unsuccessful Call Note 08/09/2019 Name: Gwendolyn Fuller MRN: [id number] DOB: Dec 05, 1960  Patient  is a 58 year old female who sees Raelyn Ensign, FNP for primary care. Raelyn Ensign, FNP asked the CCM team to consult the patient for community resources.      This social worker was unable to reach patient via telephone today for follow up call. I have left HIPAA compliant voicemail asking patient to return my call. (unsuccessful outreach #1).   Plan: Will follow-up within 7 business days via telephone.      Elliot Gurney, [place] Administrator, [facility] Center[facility] Care Management [phone]

## 2019-08-12 ENCOUNTER — Ambulatory Visit: Payer: Medicaid Other | Admitting: Family Medicine

## 2019-08-16 ENCOUNTER — Ambulatory Visit: Payer: Self-pay | Admitting: *Deleted

## 2019-08-16 ENCOUNTER — Telehealth: Payer: Self-pay

## 2019-08-16 NOTE — Chronic Care Management (AMB) (Signed)
    Care Management   Unsuccessful Call Note 08/16/2019 Name: Gwendolyn Fuller MRN: [id number] DOB: 1961-08-18   Patient is a 57 year old  Female who sees Raelyn Ensign, FNP for primary care. Raelyn Ensign, FNP asked the CCM team to consult the patient for community resources related assist with her transportation needs.     This social worker was unable to reach patient via telephone today to follow up on resources previously provided. I have left HIPAA compliant voicemail asking patient to return my call. (unsuccessful outreach #2).   Plan: Will follow-up within 7 business days via telephone.      Sheralyn Boatman [facility] Care Management [phone]

## 2019-08-23 ENCOUNTER — Telehealth: Payer: Self-pay | Admitting: *Deleted

## 2019-08-23 ENCOUNTER — Ambulatory Visit: Payer: Self-pay | Admitting: *Deleted

## 2019-08-23 NOTE — Chronic Care Management (AMB) (Signed)
   Care Management   Unsuccessful Call Note 08/23/2019 Name: Gwendolyn Fuller MRN: [id number] DOB: 16-Jun-1961  Patient  is a 58 year old female who sees Raelyn Ensign, FNP for primary care. Raelyn Ensign, FNP asked the CCM team to consult the patient for community resources to address her transportation needs.    This social worker was unable to reach patient via telephone today for follow up call regarding her transportation needs. I have left HIPAA compliant voicemail asking patient to return my call. (unsuccessful outreach #3).   Plan: This Education officer, museum will make no additional calls to patient, however will be happy to engage patient upon her return call     Elliot Gurney, [place] Worker  [place] [facility] Care Management [phone]

## 2019-09-09 ENCOUNTER — Telehealth: Payer: Self-pay | Admitting: Family Medicine

## 2019-09-09 ENCOUNTER — Telehealth: Payer: Self-pay | Admitting: Emergency Medicine

## 2019-09-09 DIAGNOSIS — E1140 Type 2 diabetes mellitus with diabetic neuropathy, unspecified: Secondary | ICD-10-CM

## 2019-09-09 DIAGNOSIS — IMO0002 Reserved for concepts with insufficient information to code with codable children: Secondary | ICD-10-CM

## 2019-09-09 MED ORDER — LEVEMIR FLEXTOUCH 100 UNIT/ML ~~LOC~~ SOPN: PEN_INJECTOR | SUBCUTANEOUS | 0 refills | Status: AC

## 2019-09-09 NOTE — Telephone Encounter (Signed)
Please schedule patient for follow up in the next 10 days.

## 2019-09-09 NOTE — Telephone Encounter (Signed)
Call cannot be completed  

## 2019-09-09 NOTE — Telephone Encounter (Signed)
Order sent to [facility] NP for refill

## 2019-09-09 NOTE — Telephone Encounter (Signed)
Medication Refill - Medication: Insulin Detemir (LEVEMIR FLEXTOUCH) 100 UNIT/ML Pen  Has the patient contacted their pharmacy? Yes - states they are not able to get through to [place] (Agent: If no, request that the patient contact the pharmacy for the refill.) (Agent: If yes, when and what did the pharmacy advise?)  Preferred Pharmacy (with phone number or street name):  [id number] - [place], [place] - [address] [phone] (Phone) [phone] (Fax)   Agent: Please be advised that RX refills may take up to 3 business days. We ask that you follow-up with your pharmacy.

## 2019-09-12 ENCOUNTER — Ambulatory Visit: Payer: Self-pay

## 2019-09-12 NOTE — Telephone Encounter (Signed)
Her medication was already sent in on 11/13, needs follow up for 3 month supply. Bonnita Nasuti CMA spoke with patient and advised to go to ER as well if BG >500.

## 2019-09-12 NOTE — Telephone Encounter (Signed)
I called the medication into pharmacy as you sent it on 11/13

## 2019-09-12 NOTE — Telephone Encounter (Signed)
Patient called stating that her BS 525 this AM because she has been out of her insulin.  She states that she has requested new RX several times.  She feels shaky and thirsty.  She is vomiting. Per protocol patient was ask to go to Er for treatment and refused stating that she need to speak with the doctor. She was urged to go to ER and told of the dangers if she was not treated. She verbalized understanding but insisting on talking with the nurse at the office or her PCP. Call transferred to office Melissa had the nurse transferred into call.  Reason for Disposition . [1] Vomiting AND [2] signs of dehydration (e.g., very dry mouth, lightheaded, dark urine)  Answer Assessment - Initial Assessment Questions 1. BLOOD GLUCOSE: "What is your blood glucose level?"      525 2. ONSET: "When did you check the blood glucose?"     this 3. USUAL RANGE: "What is your glucose level usually?" (e.g., usual fasting morning value, usual evening value)     Morning value 4. KETONES: "Do you check for ketones (urine or blood test strips)?" If yes, ask: "What does the test show now?"      *No Answer* 5. TYPE 1 or 2:  "Do you know what type of diabetes you have?"  (e.g., Type 1, Type 2, Gestational; doesn't know)       6. INSULIN: "Do you take insulin?" "What type of insulin(s) do you use? What is the mode of delivery? (syringe, pen; injection or pump)?"      Yes but has not received her insulin 7. DIABETES PILLS: "Do you take any pills for your diabetes?" If yes, ask: "Have you missed taking any pills recently?"   Yes has been out of insulin 8. OTHER SYMPTOMS: "Do you have any symptoms?" (e.g., fever, frequent urination, difficulty breathing, dizziness, weakness, vomiting)    vomiting 9. PREGNANCY: "Is there any chance you are pregnant?" "When was your last menstrual period?"     N/A  Protocols used: DIABETES - HIGH BLOOD SUGAR-A-AH

## 2019-09-21 ENCOUNTER — Telehealth: Payer: Self-pay

## 2019-09-21 NOTE — Telephone Encounter (Signed)
Left voicemail for pt to inform her that it is time for her annual lung cancer screening. Instructed pt to call back to confirm information prior to CT scan being scheduled. 

## 2019-09-26 ENCOUNTER — Other Ambulatory Visit: Payer: Self-pay

## 2019-09-26 DIAGNOSIS — E1140 Type 2 diabetes mellitus with diabetic neuropathy, unspecified: Secondary | ICD-10-CM

## 2019-09-26 DIAGNOSIS — IMO0002 Reserved for concepts with insufficient information to code with codable children: Secondary | ICD-10-CM

## 2019-09-26 MED ORDER — GABAPENTIN 300 MG PO CAPS: 300.0000 mg | ORAL_CAPSULE | Freq: Two times a day (BID) | ORAL | 0 refills | Status: AC

## 2019-09-26 NOTE — Telephone Encounter (Signed)
Please schedule patient for follow up in the next 7 days.  

## 2019-09-27 NOTE — Telephone Encounter (Signed)
LVM to sch appt

## 2019-09-28 ENCOUNTER — Other Ambulatory Visit: Payer: Self-pay

## 2019-09-28 DIAGNOSIS — IMO0002 Reserved for concepts with insufficient information to code with codable children: Secondary | ICD-10-CM

## 2019-09-28 DIAGNOSIS — E1165 Type 2 diabetes mellitus with hyperglycemia: Secondary | ICD-10-CM

## 2019-09-29 ENCOUNTER — Other Ambulatory Visit: Payer: Self-pay

## 2019-09-29 DIAGNOSIS — E1140 Type 2 diabetes mellitus with diabetic neuropathy, unspecified: Secondary | ICD-10-CM

## 2019-09-29 DIAGNOSIS — IMO0002 Reserved for concepts with insufficient information to code with codable children: Secondary | ICD-10-CM

## 2019-10-03 ENCOUNTER — Other Ambulatory Visit: Payer: Self-pay

## 2019-10-05 NOTE — Telephone Encounter (Signed)
Left message for patient to notify them that it is time to schedule annual low dose lung cancer screening CT scan. Instructed patient to call back to verify information prior to the scan being scheduled.  

## 2019-11-23 ENCOUNTER — Emergency Department: Payer: Medicaid Other

## 2019-11-23 ENCOUNTER — Other Ambulatory Visit: Payer: Self-pay

## 2019-11-23 ENCOUNTER — Emergency Department
Admission: EM | Admit: 2019-11-23 | Discharge: 2019-11-23 | Disposition: A | Discharge status: Home / Self Care | Payer: Medicaid Other | Attending: Student in an Organized Health Care Education/Training Program | Admitting: Student in an Organized Health Care Education/Training Program

## 2019-11-23 ENCOUNTER — Encounter: Payer: Self-pay | Admitting: *Deleted

## 2019-11-23 DIAGNOSIS — R2231 Localized swelling, mass and lump, right upper limb: Secondary | ICD-10-CM | POA: Insufficient documentation

## 2019-11-23 DIAGNOSIS — I10 Essential (primary) hypertension: Secondary | ICD-10-CM | POA: Diagnosis not present

## 2019-11-23 DIAGNOSIS — M546 Pain in thoracic spine: Secondary | ICD-10-CM | POA: Insufficient documentation

## 2019-11-23 DIAGNOSIS — Z87891 Personal history of nicotine dependence: Secondary | ICD-10-CM | POA: Diagnosis not present

## 2019-11-23 DIAGNOSIS — J449 Chronic obstructive pulmonary disease, unspecified: Secondary | ICD-10-CM | POA: Insufficient documentation

## 2019-11-23 DIAGNOSIS — M7989 Other specified soft tissue disorders: Secondary | ICD-10-CM

## 2019-11-23 DIAGNOSIS — Z794 Long term (current) use of insulin: Secondary | ICD-10-CM | POA: Insufficient documentation

## 2019-11-23 DIAGNOSIS — E119 Type 2 diabetes mellitus without complications: Secondary | ICD-10-CM | POA: Diagnosis not present

## 2019-11-23 DIAGNOSIS — Z79899 Other long term (current) drug therapy: Secondary | ICD-10-CM | POA: Diagnosis not present

## 2019-11-23 LAB — CBC WITH DIFFERENTIAL/PLATELET
Abs Immature Granulocytes: 0.05 10*3/uL (ref 0.00–0.07)
Basophils Absolute: 0 10*3/uL (ref 0.0–0.1)
Basophils Relative: 0 %
Eosinophils Absolute: 0.1 10*3/uL (ref 0.0–0.5)
Eosinophils Relative: 1 %
HCT: 50.1 % — ABNORMAL HIGH (ref 36.0–46.0)
Hemoglobin: 17 g/dL — ABNORMAL HIGH (ref 12.0–15.0)
Immature Granulocytes: 1 %
Lymphocytes Relative: 31 %
Lymphs Abs: 3.2 10*3/uL (ref 0.7–4.0)
MCH: 29.4 pg (ref 26.0–34.0)
MCHC: 33.9 g/dL (ref 30.0–36.0)
MCV: 86.5 fL (ref 80.0–100.0)
Monocytes Absolute: 0.5 10*3/uL (ref 0.1–1.0)
Monocytes Relative: 5 %
Neutro Abs: 6.3 10*3/uL (ref 1.7–7.7)
Neutrophils Relative %: 62 %
Platelets: 300 10*3/uL (ref 150–400)
RBC: 5.79 MIL/uL — ABNORMAL HIGH (ref 3.87–5.11)
RDW: 13.8 % (ref 11.5–15.5)
WBC: 10.1 10*3/uL (ref 4.0–10.5)
nRBC: 0 % (ref 0.0–0.2)

## 2019-11-23 LAB — COMPREHENSIVE METABOLIC PANEL
ALT: 34 U/L (ref 0–44)
AST: 23 U/L (ref 15–41)
Albumin: 4.6 g/dL (ref 3.5–5.0)
Alkaline Phosphatase: 114 U/L (ref 38–126)
Anion gap: 12 (ref 5–15)
BUN: 13 mg/dL (ref 6–20)
CO2: 24 mmol/L (ref 22–32)
Calcium: 9.6 mg/dL (ref 8.9–10.3)
Chloride: 103 mmol/L (ref 98–111)
Creatinine, Ser: 0.63 mg/dL (ref 0.44–1.00)
GFR calc Af Amer: >60 mL/min (ref >60)
GFR calc non Af Amer: >60 mL/min (ref >60)
Glucose, Bld: 182 mg/dL — ABNORMAL HIGH (ref 70–99)
Potassium: 4.2 mmol/L (ref 3.5–5.1)
Sodium: 139 mmol/L (ref 135–145)
Total Bilirubin: 0.9 mg/dL (ref 0.3–1.2)
Total Protein: 8.8 g/dL — ABNORMAL HIGH (ref 6.5–8.1)

## 2019-11-23 LAB — TROPONIN I (HIGH SENSITIVITY)
Troponin I (High Sensitivity): <2 ng/L (ref <18)
Troponin I (High Sensitivity): <2 ng/L (ref <18)

## 2019-11-23 LAB — LIPASE, BLOOD: Lipase: 29 U/L (ref 11–51)

## 2019-11-23 MED ORDER — METHOCARBAMOL 500 MG PO TABS
1000.0000 mg | ORAL_TABLET | Freq: Once | ORAL | Status: AC
  Administered 2019-11-23: 1000 mg via ORAL
  Filled 2019-11-23: qty 2

## 2019-11-23 MED ORDER — CELECOXIB 50 MG PO CAPS: 100.0000 mg | ORAL_CAPSULE | Freq: Two times a day (BID) | ORAL | 0 refills | Status: AC

## 2019-11-23 MED ORDER — CEPHALEXIN 500 MG PO CAPS: 500.0000 mg | ORAL_CAPSULE | Freq: Three times a day (TID) | ORAL | 0 refills | Status: AC

## 2019-11-23 MED ORDER — SULFAMETHOXAZOLE-TRIMETHOPRIM 800-160 MG PO TABS: 1.0000 | ORAL_TABLET | Freq: Two times a day (BID) | ORAL | 0 refills | Status: AC

## 2019-11-23 MED ORDER — IOHEXOL 350 MG/ML SOLN
75.0000 mL | Freq: Once | INTRAVENOUS | Status: AC | PRN
  Administered 2019-11-23: 75 mL via INTRAVENOUS
  Filled 2019-11-23: qty 75

## 2019-11-23 MED ORDER — METHOCARBAMOL 500 MG PO TABS: 500.0000 mg | ORAL_TABLET | Freq: Three times a day (TID) | ORAL | 0 refills | Status: AC | PRN

## 2019-11-23 MED ORDER — KETOROLAC TROMETHAMINE 30 MG/ML IJ SOLN
30.0000 mg | Freq: Once | INTRAMUSCULAR | Status: AC
  Administered 2019-11-23: 30 mg via INTRAMUSCULAR
  Filled 2019-11-23: qty 1

## 2019-11-23 NOTE — ED Triage Notes (Signed)
Pt has shingles.  Pt reports left side back pain.  Pt fell yesterday and has right hand pain.  Pt reports shingles for 1 month and it's worse.  Pt alert  Speech clear.

## 2019-11-23 NOTE — ED Notes (Signed)
Pt provided with phone to call neighbor.

## 2019-11-23 NOTE — Discharge Instructions (Signed)
Take Robaxin and Celebrex for upper back pain. Please take both Bactrim and Keflex for right index finger swelling and redness. Return to the emergency department with new or worsening symptoms.

## 2019-11-23 NOTE — ED Provider Notes (Signed)
Emergency Department Provider Note  ____________________________________________  Time seen: Approximately 4:43 PM  I have reviewed the triage vital signs and the nursing notes.   HISTORY  Chief Complaint Back Pain   Historian Patient     HPI Gwendolyn Fuller is a 59 y.o. female with a history of dyspnea and diabetes, presents to the emergency department with right sided upper back pain worse with deep inspiration.  Triage note noted.  Patient states that she has been seen and evaluated by her primary care provider. She describes back pain as a burning sensation, worse with deep inspiration and sometimes associated with shortness of breath.  She had rash along right upper back that primary care diagnosed as shingles.  She has been trying triamcinolone cream at home but states that it has not been improving her pain. Rash has since resolved but pain has persisted.  She secondarily complains of right hand pain after she took a fall while outside and states that she has some thorns stuck in her right index finger and would also like to rule out fracture. She has had some swelling and erythema along index finger. She denies fever or chills at home.  She denies chest pain or abdominal pain.  No other alleviating measures have been attempted.   Past Medical History:  Diagnosis Date  . Arthritis   . Diabetes mellitus without complication (HCC)   . Dyspnea      Immunizations up to date:  Yes.     Past Medical History:  Diagnosis Date  . Arthritis   . Diabetes mellitus without complication (HCC)   . Dyspnea     Patient Active Problem List   Diagnosis Date Noted  . Leukocytosis 11/12/2018  . 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 Erythrocytosis 11/12/2018  . Cervical radiculopathy 11/09/2018  . Personal history of tobacco use, presenting hazards to health 10/04/2018  . Chronic lower back pain 01/22/2018  . Vitamin D deficiency 04/22/2016  . Carpal tunnel syndrome, bilateral 04/21/2016   . Uncontrolled type 2 diabetes with neuropathy (HCC) 06/19/2015  . Essential hypertension 06/19/2015  . Hyperlipidemia 06/19/2015  . COLD (chronic obstructive lung disease) (HCC) 06/19/2015    Past Surgical History:  Procedure Laterality Date  . CARPAL TUNNEL RELEASE Bilateral   . COLONOSCOPY WITH PROPOFOL N/A 05/22/2017   Procedure: COLONOSCOPY WITH PROPOFOL;  Surgeon: Wyline Mood, MD;  Location: [facility] ENDOSCOPY;  Service: Endoscopy;  Laterality: N/A;    Prior to Admission medications   Medication Sig Start Date End Date Taking? Authorizing Provider  Accu-Chek FastClix Lancets MISC Check fasting sugar 3 times a week and post-prandial (1-2 hours after eating) 3 times a week. 05/11/19   Poulose, Percell Belt, NP  ACCU-CHEK GUIDE test strip USE UP TO 3 TIMES A DAY AS DIRECTED E11.65, LON 99 MONTHS 06/27/19   Cheryle Horsfall, NP  albuterol (PROAIR HFA) 108 (90 Base) MCG/ACT inhaler USE 2 PUFFS FOUR TIMES DAILY AS NEEDED 04/05/19   Poulose, Percell Belt, NP  budesonide-formoterol (SYMBICORT) 160-4.5 MCG/ACT inhaler Inhale 2 puffs into the lungs 2 (two) times daily. 08/31/18   Poulose, Percell Belt, NP  celecoxib (CELEBREX) 50 MG capsule Take 2 capsules (100 mg total) by mouth 2 (two) times daily for 5 days. 11/23/19 11/28/19  Orvil Feil, PA-C  cephALEXin (KEFLEX) 500 MG capsule Take 1 capsule (500 mg total) by mouth 3 (three) times daily for 7 days. 11/23/19 11/30/19  Orvil Feil, PA-C  gabapentin (NEURONTIN) 300 MG capsule Take 1 capsule (300 mg total)  by mouth 2 (two) times daily. 09/26/19   Doren Custard, FNP  glimepiride (AMARYL) 4 MG tablet TAKE 2 TABLETS BY MOUTH EVERY DAY WITH BREAKFAST 07/01/19   Poulose, Percell Belt, NP  Icosapent Ethyl (VASCEPA) 1 g CAPS Two capsules by mouth twice a day (to help with triglycerides) 11/10/18   Lada, Janit Bern, MD  Insulin Detemir (LEVEMIR FLEXTOUCH) 100 UNIT/ML Pen INJECT 50 UNITS INTO THE SKIN DAILY. 09/09/19   Doren Custard, FNP  Lidocaine HCl 2 %  SOLN Use as directed in the mouth or throat.    [provider]  lisinopril (ZESTRIL) 10 MG tablet Take 1 tablet (10 mg total) by mouth daily. 05/11/19   Poulose, Percell Belt, NP  meloxicam (MOBIC) 7.5 MG tablet TAKE 1 TAB 2 TIMES DAILY AS NEEDED FOR PAIN NO OTHER NSAIDS, CAN TAKE ADDITIONAL ACETAMINOPHEN 04/05/19   Poulose, Percell Belt, NP  metFORMIN (GLUCOPHAGE) 1000 MG tablet Take 1 tablet (1,000 mg total) by mouth 2 (two) times daily with a meal. 05/11/19   Poulose, Percell Belt, NP  methocarbamol (ROBAXIN) 500 MG tablet Take 1 tablet (500 mg total) by mouth every 8 (eight) hours as needed for up to 5 days. 11/23/19 11/28/19  Orvil Feil, PA-C  rosuvastatin (CRESTOR) 40 MG tablet Take 1 tablet (40 mg total) by mouth daily. 11/10/18   Lada, Janit Bern, MD  sitaGLIPtin (JANUVIA) 100 MG tablet Take 1 tablet (100 mg total) by mouth daily. 12/28/18   Kerman Passey, MD  sulfamethoxazole-trimethoprim (BACTRIM DS) 800-160 MG tablet Take 1 tablet by mouth 2 (two) times daily for 7 days. 11/23/19 11/30/19  Pia Mau M, PA-C  tiZANidine (ZANAFLEX) 2 MG tablet TAKE ONE TABLET BY MOUTH THREE TIMES DAILY AS NEEDED 09/29/18   Lada, Janit Bern, MD  UNIFINE PENTIPS 32G X 4 MM MISC USE AS DIRECTED WITH TRESIBA. ONCE A DAY 06/20/19   Poulose, Percell Belt, NP  Vitamin D, Ergocalciferol, (DRISDOL) 1.25 MG (50000 UT) CAPS capsule TAKE 1 CAPSULE (50,000 UNITS TOTAL) BY MOUTH EVERY 30 (THIRTY) DAYS. 05/24/19   Cheryle Horsfall, NP    Allergies Patient has no known allergies.  Family History  Problem Relation Age of Onset  . Diabetes Mother   . Cancer Mother        colon  . Heart disease Mother   . Hypertension Mother   . Diabetes Father   . Parkinson's disease Father   . Alzheimer's disease Father   . Diabetes Maternal Grandmother   . Diabetes Paternal Grandmother   . Cancer Paternal Grandfather        stamoach?    Social History Social History   Tobacco Use  . Smoking status: Former Smoker     Types: Cigarettes    Quit date: 12/26/2018    Years since quitting: 0.9  . Smokeless tobacco: Never Used  Substance Use Topics  . Alcohol use: No  . Drug use: No     Review of Systems  Constitutional: No fever/chills Eyes:  No discharge ENT: No upper respiratory complaints. Respiratory: no cough. No SOB/ use of accessory muscles to breath Gastrointestinal:   No nausea, no vomiting.  No diarrhea.  No constipation. Musculoskeletal: Patient has right sided upper back pain.  Skin: Patient has right index finger swelling.    ____________________________________________   PHYSICAL EXAM:  VITAL SIGNS: ED Triage Vitals  Enc Vitals Group     BP 11/23/19 1605 128/72     Pulse Rate 11/23/19 1605 94  Resp 11/23/19 1605 20     Temp 11/23/19 1605 97.6 F (36.4 C)     Temp Source 11/23/19 1605 Oral     SpO2 11/23/19 1605 98 %     Weight 11/23/19 1606 152 lb (68.9 kg)     Height 11/23/19 1606 5\' 2"  (1.575 m)     Head Circumference --      Peak Flow --      Pain Score 11/23/19 1623 10     Pain Loc --      Pain Edu? --      Excl. in [place]? --      Constitutional: Alert and oriented. Well appearing and in no acute distress. Eyes: Conjunctivae are normal. PERRL. EOMI. Head: Atraumatic. ENT:      Ears: TMs are pearly.       Nose: No congestion/rhinnorhea.      Mouth/Throat: Mucous membranes are moist.  Neck: No stridor.  No cervical spine tenderness to palpation. Cardiovascular: Normal rate, regular rhythm. Normal S1 and S2.  Good peripheral circulation. Respiratory: Normal respiratory effort without tachypnea or retractions. Lungs CTAB. Good air entry to the bases with no decreased or absent breath sounds Gastrointestinal: Bowel sounds x 4 quadrants. Soft and nontender to palpation. No guarding or rigidity. No distention. Musculoskeletal: Full range of motion to all extremities. No obvious deformities noted. Patient is able to move all five right fingers. Palpable radial pulse,  right.  Neurologic:  Normal for age. No gross focal neurologic deficits are appreciated.  Skin: Patient has edema of right index finger and has erythema along the proximal aspect of the right index finger where there was a suspected thorn.  Torn removal crew while in the ED using forceps. Psychiatric: Mood and affect are normal for age. Speech and behavior are normal.   ____________________________________________   LABS (all labs ordered are listed, but only abnormal results are displayed)  Labs Reviewed  CBC WITH DIFFERENTIAL/PLATELET - Abnormal; Notable for the following components:      Result Value   RBC 5.79 (*)    Hemoglobin 17.0 (*)    HCT 50.1 (*)    All other components within normal limits  COMPREHENSIVE METABOLIC PANEL - Abnormal; Notable for the following components:   Glucose, Bld 182 (*)    Total Protein 8.8 (*)    All other components within normal limits  LIPASE, BLOOD  TROPONIN I (HIGH SENSITIVITY)  TROPONIN I (HIGH SENSITIVITY)   ____________________________________________  EKG   ____________________________________________  RADIOLOGY Unk Pinto, personally viewed and evaluated these images (plain radiographs) as part of my medical decision making, as well as reviewing the written report by the radiologist.    CT Angio Chest PE W and/or Wo Contrast  Result Date: 11/23/2019 CLINICAL DATA:  59 year old female with shortness of breath, left side back pain. Fall yesterday. EXAM: CT ANGIOGRAPHY CHEST WITH CONTRAST TECHNIQUE: Multidetector CT imaging of the chest was performed using the standard protocol during bolus administration of intravenous contrast. Multiplanar CT image reconstructions and MIPs were obtained to evaluate the vascular anatomy. CONTRAST:  1mL OMNIPAQUE IOHEXOL 350 MG/ML SOLN COMPARISON:  Chest radiographs 12/27/2018. Low-dose chest CT 10/04/2018. FINDINGS: Cardiovascular: Excellent contrast bolus timing in the pulmonary arterial tree.  No focal filling defect identified in the pulmonary arteries to suggest acute pulmonary embolism. No calcified coronary artery atherosclerosis is evident. No cardiomegaly or pericardial effusion. Negative visible aorta. Mediastinum/Nodes: Negative. No mediastinal or hilar lymphadenopathy. Lungs/Pleura: Major airways are patent. Stable lung volumes.  Stable right lower lobe medial subpleural nodule measuring 8 millimeters (series 7, image 59). Elsewhere both lungs are clear. No pleural effusion. Upper Abdomen: Negative visible liver, spleen, pancreas, adrenal glands, left kidney and bowel in the upper abdomen. Musculoskeletal: Negative. Review of the MIP images confirms the above findings. IMPRESSION: 1. Negative for acute pulmonary embolus, and largely negative chest CTA. 2. Stable right lower lobe 8 mm subpleural lung nodule since 2019. Continued annual screening with Low-dose Chest CT (LDCT) as per the 10/04/2018 exam recommendation. Electronically Signed   By: Odessa FlemingH  Hall M.D.   On: 11/23/2019 18:30   DG Hand Complete Right  Result Date: 11/23/2019 CLINICAL DATA:  Right hand pain since fall yesterday. EXAM: RIGHT HAND - COMPLETE 3+ VIEW COMPARISON:  None. FINDINGS: There is no evidence of fracture or dislocation. There is no evidence of arthropathy or other focal bone abnormality. Soft tissues are unremarkable. IMPRESSION: Negative. Electronically Signed   By: Obie DredgeWilliam T Derry M.D.   On: 11/23/2019 16:59    ____________________________________________    PROCEDURES  Procedure(s) performed:     Procedures     Medications  iohexol (OMNIPAQUE) 350 MG/ML injection 75 mL (75 mLs Intravenous Contrast Given 11/23/19 1803)  ketorolac (TORADOL) 30 MG/ML injection 30 mg (30 mg Intramuscular Given 11/23/19 1915)  methocarbamol (ROBAXIN) tablet 1,000 mg (1,000 mg Oral Given 11/23/19 1915)     ____________________________________________   INITIAL IMPRESSION / ASSESSMENT AND PLAN / ED  COURSE  Pertinent labs & imaging results that were available during my care of the patient were reviewed by me and considered in my medical decision making (see chart for details).      Assessment and plan: Upper back pain Right hand pain:  59 year old female presents to the emergency department with right-sided upper back pain that was worse with deep inspiration and persisted despite resolution of a rash that was originally diagnosed as shingles.  Patient also has right index finger erythema and edema after falling into a plant with some kind of thorns.  Vital signs are reassuring at triage.  On physical exam, patient did have some tenderness to palpation along the paraspinal muscles of the thoracic spine.   Differential diagnosis included PE, shingles, flexor tenosynovitis, cellulitis...   CTA revealed no evidence of PE.  Patient reported that she had significant improvement in her back pain with Robaxin and Toradol administered in the ED.  X-ray examination of the right hand revealed no evidence of fracture.  A single foreign was removed in the emergency department.  There was some localized erythema and edema along the right index finger.  Patient was treated empirically with Bactrim and Keflex as I am concerned for early cellulitis.  Patient was given follow-up referral to Dr. Stephenie AcresSoria for right index finger pain and swelling but was cautioned to return to the emergency department if right index finger pain worsened.  She voiced understanding and has easy access to the ED.  All patient questions were answered.   ____________________________________________  FINAL CLINICAL IMPRESSION(S) / ED DIAGNOSES  Final diagnoses:  Acute right-sided thoracic back pain  Swelling of right index finger      NEW MEDICATIONS STARTED DURING THIS VISIT:  ED Discharge Orders         Ordered    methocarbamol (ROBAXIN) 500 MG tablet  Every 8 hours PRN     11/23/19 1949    celecoxib (CELEBREX) 50  MG capsule  2 times daily     11/23/19 1949    sulfamethoxazole-trimethoprim (  BACTRIM DS) 800-160 MG tablet  2 times daily     11/23/19 1949    cephALEXin (KEFLEX) 500 MG capsule  3 times daily     11/23/19 1949              This chart was dictated using voice recognition software/Dragon. Despite best efforts to proofread, errors can occur which can change the meaning. Any change was purely unintentional.     Gasper Lloyd 11/23/19 2012    Willy Eddy, MD 11/23/19 2237

## 2020-01-24 ENCOUNTER — Telehealth: Payer: Self-pay

## 2020-01-24 NOTE — Telephone Encounter (Signed)
Message left notifying patient that it is time to schedule the low dose lung cancer screening CT scan.  Instructed patient to return call to Shawn Perkins at [phone] to verify information prior to CT scan being scheduled.    

## 2020-04-13 ENCOUNTER — Telehealth: Payer: Self-pay | Admitting: *Deleted

## 2020-04-13 NOTE — Telephone Encounter (Signed)
(  04/13/2020) Left message for pt to notify them that it is time to schedule annual low dose lung cancer screening CT scan. Instructed patient to call back to verify information prior to the scan being scheduled °SRW °  ° ° °

## 2020-05-25 ENCOUNTER — Encounter: Payer: Self-pay | Admitting: *Deleted

## 2020-12-13 ENCOUNTER — Other Ambulatory Visit: Payer: Self-pay | Admitting: Physician Assistant

## 2020-12-13 DIAGNOSIS — Z1231 Encounter for screening mammogram for malignant neoplasm of breast: Secondary | ICD-10-CM

## 2023-01-12 ENCOUNTER — Other Ambulatory Visit: Payer: Self-pay | Admitting: Family Medicine

## 2023-01-12 DIAGNOSIS — M79672 Pain in left foot: Secondary | ICD-10-CM

## 2023-01-12 DIAGNOSIS — M25512 Pain in left shoulder: Secondary | ICD-10-CM

## 2024-03-09 ENCOUNTER — Other Ambulatory Visit: Payer: Self-pay | Admitting: Family Medicine

## 2024-03-09 DIAGNOSIS — Z1231 Encounter for screening mammogram for malignant neoplasm of breast: Secondary | ICD-10-CM
